# Patient Record
Sex: Male | Born: 1980
Health system: Southern US, Community
[De-identification: ages and names within clinical notes are randomized; demographics above are authoritative.]

## PROBLEM LIST (undated history)

## (undated) DIAGNOSIS — E559 Vitamin D deficiency, unspecified: Secondary | ICD-10-CM

## (undated) DIAGNOSIS — E669 Obesity, unspecified: Secondary | ICD-10-CM

## (undated) DIAGNOSIS — D72819 Decreased white blood cell count, unspecified: Secondary | ICD-10-CM

## (undated) DIAGNOSIS — L309 Dermatitis, unspecified: Secondary | ICD-10-CM

## (undated) DIAGNOSIS — J309 Allergic rhinitis, unspecified: Secondary | ICD-10-CM

## (undated) DIAGNOSIS — E66811 Obesity, class 1: Secondary | ICD-10-CM

## (undated) DIAGNOSIS — M5412 Radiculopathy, cervical region: Secondary | ICD-10-CM

## (undated) DIAGNOSIS — E781 Pure hyperglyceridemia: Secondary | ICD-10-CM

## (undated) HISTORY — PX: NO PAST SURGERIES: SHX2092

## (undated) HISTORY — DX: Vitamin D deficiency, unspecified: E55.9

## (undated) HISTORY — DX: Allergic rhinitis, unspecified: J30.9

## (undated) HISTORY — DX: Obesity, unspecified: E66.9

## (undated) HISTORY — DX: Pure hyperglyceridemia: E78.1

## (undated) HISTORY — DX: Obesity, class 1: E66.811

## (undated) HISTORY — DX: Decreased white blood cell count, unspecified: D72.819

## (undated) HISTORY — DX: Dermatitis, unspecified: L30.9

## (undated) HISTORY — DX: Radiculopathy, cervical region: M54.12

---

## 2002-04-01 ENCOUNTER — Encounter: Payer: Self-pay | Admitting: Emergency Medicine

## 2002-04-01 ENCOUNTER — Emergency Department (HOSPITAL_COMMUNITY): Admission: AC | Admit: 2002-04-01 | Discharge: 2002-04-02 | Payer: Self-pay

## 2002-04-02 ENCOUNTER — Encounter: Payer: Self-pay | Admitting: Emergency Medicine

## 2002-04-04 ENCOUNTER — Emergency Department (HOSPITAL_COMMUNITY): Admission: EM | Admit: 2002-04-04 | Discharge: 2002-04-04 | Payer: Self-pay | Admitting: Emergency Medicine

## 2002-04-08 ENCOUNTER — Emergency Department (HOSPITAL_COMMUNITY): Admission: EM | Admit: 2002-04-08 | Discharge: 2002-04-08 | Payer: Self-pay

## 2012-09-08 DIAGNOSIS — M5412 Radiculopathy, cervical region: Secondary | ICD-10-CM

## 2012-09-08 HISTORY — DX: Radiculopathy, cervical region: M54.12

## 2013-09-05 ENCOUNTER — Ambulatory Visit (INDEPENDENT_AMBULATORY_CARE_PROVIDER_SITE_OTHER): Payer: BC Managed Care – PPO | Admitting: Physician Assistant

## 2013-09-05 ENCOUNTER — Ambulatory Visit: Payer: BC Managed Care – PPO

## 2013-09-05 VITALS — BP 120/72 | HR 56 | Temp 97.6°F | Resp 18 | Ht 67.0 in | Wt 212.0 lb

## 2013-09-05 DIAGNOSIS — M545 Low back pain, unspecified: Secondary | ICD-10-CM

## 2013-09-05 DIAGNOSIS — M25562 Pain in left knee: Secondary | ICD-10-CM

## 2013-09-05 DIAGNOSIS — M542 Cervicalgia: Secondary | ICD-10-CM

## 2013-09-05 MED ORDER — MELOXICAM 15 MG PO TABS: 15.0000 mg | ORAL_TABLET | Freq: Every day | ORAL | Status: DC

## 2013-09-05 MED ORDER — CYCLOBENZAPRINE HCL 10 MG PO TABS: 10.0000 mg | ORAL_TABLET | Freq: Three times a day (TID) | ORAL | Status: DC | PRN

## 2013-09-05 NOTE — Patient Instructions (Signed)
The cyclobenzaprine can cause drowsiness.  Do not take it and plan to drive, or do any other activity where you need to be alert. You need to use good body mechanics and extra padding for the LEFT knee while working.
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
## 2013-09-27 ENCOUNTER — Ambulatory Visit (INDEPENDENT_AMBULATORY_CARE_PROVIDER_SITE_OTHER): Payer: BC Managed Care – PPO | Admitting: Family Medicine

## 2013-09-27 VITALS — BP 122/82 | HR 60 | Temp 98.1°F | Resp 16 | Ht 68.0 in | Wt 209.0 lb

## 2013-09-27 DIAGNOSIS — R202 Paresthesia of skin: Principal | ICD-10-CM

## 2013-09-27 DIAGNOSIS — R209 Unspecified disturbances of skin sensation: Secondary | ICD-10-CM

## 2013-09-27 DIAGNOSIS — R200 Anesthesia of skin: Secondary | ICD-10-CM

## 2013-09-27 MED ORDER — TIZANIDINE HCL 4 MG PO TABS: 4.0000 mg | ORAL_TABLET | Freq: Every day | ORAL | Status: DC

## 2013-09-27 MED ORDER — PREDNISONE 20 MG PO TABS: 40.0000 mg | ORAL_TABLET | Freq: Every day | ORAL | Status: DC
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
## 2013-09-27 NOTE — Patient Instructions (Signed)
Paresthesia °Paresthesia is an abnormal burning or prickling sensation. This sensation is generally felt in the hands, arms, legs, or feet. However, it may occur in any part of the body. It is usually not painful. The feeling may be described as: °· Tingling or numbness. °· "Pins and needles." °· Skin crawling. °· Buzzing. °· Limbs "falling asleep." °· Itching. °Most people experience temporary (transient) paresthesia at some time in their lives. °CAUSES  °Paresthesia may occur when you breathe too quickly (hyperventilation). It can also occur without any apparent cause. Commonly, paresthesia occurs when pressure is placed on a nerve. The feeling quickly goes away once the pressure is removed. For some people, however, paresthesia is a long-lasting (chronic) condition caused by an underlying disorder. The underlying disorder may be: °· A traumatic, direct injury to nerves. Examples include a: °· Broken (fractured) neck. °· Fractured skull. °· A disorder affecting the brain and spinal cord (central nervous system). Examples include: °· Transverse myelitis. °· Encephalitis. °· Transient ischemic attack. °· Multiple sclerosis. °· Stroke. °· Tumor or blood vessel problems, such as an arteriovenous malformation pressing against the brain or spinal cord. °· A condition that damages the peripheral nerves (peripheral neuropathy). Peripheral nerves are not part of the brain and spinal cord. These conditions include: °· Diabetes. °· Peripheral vascular disease. °· Nerve entrapment syndromes, such as carpal tunnel syndrome. °· Shingles. °· Hypothyroidism. °· Vitamin B12 deficiencies. °· Alcoholism. °· Heavy metal poisoning (lead, arsenic). °· Rheumatoid arthritis. °· Systemic lupus erythematosus. °DIAGNOSIS  °Your caregiver will attempt to find the underlying cause of your paresthesia. Your caregiver may: °· Take your medical history. °· Perform a physical exam. °· Order various lab tests. °· Order imaging tests. °TREATMENT    °Treatment for paresthesia depends on the underlying cause. °HOME CARE INSTRUCTIONS °· Avoid drinking alcohol. °· You may consider massage or acupuncture to help relieve your symptoms. °· Keep all follow-up appointments as directed by your caregiver. °SEEK IMMEDIATE MEDICAL CARE IF:  °· You feel weak. °· You have trouble walking or moving. °· You have problems with speech or vision. °· You feel confused. °· You cannot control your bladder or bowel movements. °· You feel numbness after an injury. °· You faint. °· Your burning or prickling feeling gets worse when walking. °· You have pain, cramps, or dizziness. °· You develop a rash. °MAKE SURE YOU: °· Understand these instructions. °· Will watch your condition. °· Will get help right away if you are not doing well or get worse. °Document Released: 08/15/2002 Document Revised: 11/17/2011 Document Reviewed: 05/16/2011 °ExitCare® Patient Information ©2014 [employer]. ° °

## 2013-11-07 ENCOUNTER — Telehealth: Payer: Self-pay

## 2013-11-07 DIAGNOSIS — M79609 Pain in unspecified limb: Secondary | ICD-10-CM

## 2013-11-07 DIAGNOSIS — R202 Paresthesia of skin: Secondary | ICD-10-CM

## 2013-11-07 NOTE — Telephone Encounter (Signed)
Pt would like to talk with someone about a referral to a specialist -believe it will be orthopedic  Best number [phone]

## 2013-11-07 NOTE — Telephone Encounter (Signed)
Left message to return call 

## 2013-11-08 NOTE — Telephone Encounter (Signed)
Dr. Elbert EwingsL, pt is still having right arm paresthesia and wants to know if we can refer him somewhere. Says he's weary about wanting to come back in here because last time he came in, he sat next to someone coughing for over an hour and was sick a few days later himself. Please advise. Thanks

## 2013-11-08 NOTE — Telephone Encounter (Signed)
LMOM to return call.

## 2013-11-09 NOTE — Telephone Encounter (Signed)
Referral placed to neuro. Pt notified.

## 2013-11-10 ENCOUNTER — Telehealth: Payer: Self-pay

## 2013-11-10 NOTE — Telephone Encounter (Signed)
Looks like we referred pt to neurology, which was ok with the pt, except they ae requiring pt to pay $200 up front and he can't afford that. Wants to know what he should do

## 2013-11-14 ENCOUNTER — Ambulatory Visit: Payer: BC Managed Care – PPO | Admitting: Diagnostic Neuroimaging

## 2014-02-27 ENCOUNTER — Encounter: Payer: Self-pay | Admitting: *Deleted

## 2014-02-27 ENCOUNTER — Telehealth: Payer: Self-pay | Admitting: *Deleted

## 2014-02-27 NOTE — Telephone Encounter (Signed)
Medication List and allergies:  Reviewed and updated   Local prescriptions:  [facility]  Immunizations Due: Pna, Tdap  A/P FH, PSH, or Personal Hx: Reviewed and updated Flu vaccine: Never Tdap: None on record PNA: Never    To Discuss with Provider: Not at this time

## 2014-02-27 NOTE — Telephone Encounter (Signed)
Left message on "home number with vm that states [employer]" for the patient to return my call.

## 2014-02-28 ENCOUNTER — Encounter: Payer: Self-pay | Admitting: Internal Medicine

## 2014-02-28 ENCOUNTER — Ambulatory Visit (INDEPENDENT_AMBULATORY_CARE_PROVIDER_SITE_OTHER): Payer: BC Managed Care – PPO | Admitting: Internal Medicine

## 2014-02-28 VITALS — BP 123/79 | HR 73 | Temp 98.2°F | Ht 68.7 in | Wt 201.0 lb

## 2014-02-28 DIAGNOSIS — J309 Allergic rhinitis, unspecified: Secondary | ICD-10-CM | POA: Insufficient documentation

## 2014-02-28 DIAGNOSIS — J3089 Other allergic rhinitis: Secondary | ICD-10-CM | POA: Insufficient documentation

## 2014-02-28 DIAGNOSIS — Z0000 Encounter for general adult medical examination without abnormal findings: Secondary | ICD-10-CM | POA: Insufficient documentation

## 2014-02-28 DIAGNOSIS — L989 Disorder of the skin and subcutaneous tissue, unspecified: Secondary | ICD-10-CM | POA: Insufficient documentation

## 2014-02-28 NOTE — Patient Instructions (Signed)
Please come back fasting for labs at your earliest convenience: FLP, CMP, CBC, TSH, HIV--- dx V70  For allergies use OTC Flonase nasal spray, 2 sprays on each side of the nose daily  Next visit : one year, fasting for another physical     Testicular Self-Exam A self-examination of your testicles involves looking at and feeling your testicles for abnormal lumps or swelling. Several things can cause swelling, lumps, or pain in your testicles. Some of these causes are:  Injuries.  Inflammation.  Infection.  Accumulation of fluids around your testicle (hydrocele).  Twisted testicles (testicular torsion).  Testicular cancer. Self-examination of the testicles and groin areas may be advised if you are at risk for testicular cancer. Risks for testicular cancer include:  An undescended testicle (cryptorchidism).  A history of previous testicular cancer.  A family history of testicular cancer. The testicles are easiest to examine after warm baths or showers and are more difficult to examine when you are cold. This is because the muscles attached to the testicles retract and pull them up higher or into the abdomen. Follow these steps while you are standing:  Hold your penis away from your body.  Roll one testicle between your thumb and forefinger, feeling the entire testicle.  Roll the other testicle between your thumb and forefinger, feeling the entire testicle. Feel for lumps, swelling, or discomfort. A normal testicle is egg shaped and feels firm. It is smooth and not tender. The spermatic cord can be felt as a firm spaghetti-like cord at the back of your testicle. It is also important to examine the crease between the front of your leg and your abdomen. Feel for any bumps that are tender. These could be enlarged lymph nodes.  Document Released: 12/01/2000 Document Revised: 04/27/2013 Document Reviewed: 02/14/2013 [facility] Patient Information 2015 [employer] [place]. This information is  not intended to replace advice given to you by your health care provider. Make sure you discuss any questions you have with your health care provider.

## 2014-02-28 NOTE — Assessment & Plan Note (Signed)
Last Td --? Declined  Discussed diet, exercise, self testicular exam. Will come back fasting for labs, including HIV.

## 2014-02-28 NOTE — Progress Notes (Signed)
Pre visit review using our clinic review tool, if applicable. No additional management support is needed unless otherwise documented below in the visit note. 

## 2014-02-28 NOTE — Assessment & Plan Note (Signed)
Increased size of right suprapubic mole; also has a penile skin lesion possibly a wart. Plan: Refer to dermatology

## 2014-02-28 NOTE — Assessment & Plan Note (Signed)
Reports nasal congestion and occasional headaches when he goes to his basement at home, thinks is an allergy. Plan: Trial with Flonase

## 2014-02-28 NOTE — Progress Notes (Signed)
   Subjective:    Patient ID: Geoffrey Contreras, male    DOB: [birth year], 33 y.o.   MRN: [id number]  DOS:  02/28/2014 Type of  Visit: New patient, request a general checkup History: In general feeling well. Does have a mole on the right suprapubic area, has been growing slowly over the years and looks darker. Has a skin lesion of the penis as well.   ROS Diet--  Ok most of the time  Exercise-- active at work, no exercise  No  CP, SOB. No palpitations  Denies  nausea, vomiting diarrhea, blood in the stools (-) cough, sputum production (-) wheezing, chest congestion No dysuria, gross hematuria, difficulty urinating  No anxiety, depression    Past Medical History  Diagnosis Date  . Allergic rhinitis     Past Surgical History  Procedure Laterality Date  . No past surgeries      History   Social History  . Marital Status: Married    Spouse Name: Geoffrey Contreras    Number of Children: 1  . Years of Education: 12th   Occupational History  . flooring; matress sales    Social History Main Topics  . Smoking status: Former Smoker -- 0.50 packs/day for 5 years    Types: Cigarettes    Quit date: 09/06/2007  . Smokeless tobacco: Never Used  . Alcohol Use: 1.0 - 1.5 oz/week    2-3 drink(s) per week     Comment: only on weeknds  . Drug Use: No  . Sexual Activity: Not on file   Other Topics Concern  . Not on file   Social History Narrative   Lives with his wife and their son.        Medication List    Notice As of 02/28/2014  5:08 PM   You have not been prescribed any medications.     Family History  Problem Relation Age of Onset  . Breast cancer Mother 455  . Cancer Maternal Grandmother     Lung  . Colon cancer Neg Hx   . Prostate cancer Neg Hx   . CAD Neg Hx   . Diabetes Mother     ??       Objective:   Physical Exam BP 123/79  Pulse 73  Temp(Src) 98.2 F (36.8 C)  Ht 5' 8.7" (1.745 m)  Wt 201 lb (91.173 kg)  BMI 29.94 kg/m2  SpO2 98% General -- alert,  well-developed, NAD.  Neck --no thyromegaly  HEENT-- Not pale.  Lungs -- normal respiratory effort, no intercostal retractions, no accessory muscle use, and normal breath sounds.  Heart-- normal rate, regular rhythm, no murmur.  Abdomen-- Not distended, good bowel sounds,soft, non-tender. Skin-- 0.5x0.5 cm bicolor mole at R suprapubic area  Lesion at penile shaft ~ 3 mm  Extremities-- no pretibial edema bilaterally  Neurologic--  alert & oriented X3. Speech normal, gait appropriate for age, strength symmetric and appropriate for age.  Psych-- Cognition and judgment appear intact. Cooperative with normal attention span and concentration. No anxious or depressed appearing.      Assessment & Plan:

## 2014-03-30 ENCOUNTER — Other Ambulatory Visit (INDEPENDENT_AMBULATORY_CARE_PROVIDER_SITE_OTHER): Payer: BC Managed Care – PPO

## 2014-03-30 DIAGNOSIS — Z0000 Encounter for general adult medical examination without abnormal findings: Secondary | ICD-10-CM

## 2014-03-30 DIAGNOSIS — D72820 Lymphocytosis (symptomatic): Secondary | ICD-10-CM

## 2014-03-30 LAB — COMPREHENSIVE METABOLIC PANEL
ALBUMIN: 3.6 g/dL (ref 3.5–5.2)
ALT: 28 U/L (ref 0–53)
AST: 23 U/L (ref 0–37)
Alkaline Phosphatase: 53 U/L (ref 39–117)
BUN: 9 mg/dL (ref 6–23)
CHLORIDE: 106 meq/L (ref 96–112)
CO2: 28 meq/L (ref 19–32)
Calcium: 9 mg/dL (ref 8.4–10.5)
Creatinine, Ser: 0.9 mg/dL (ref 0.4–1.5)
GFR: 110.69 mL/min (ref >60.00)
GLUCOSE: 90 mg/dL (ref 70–99)
POTASSIUM: 4.1 meq/L (ref 3.5–5.1)
Sodium: 139 mEq/L (ref 135–145)
Total Bilirubin: 1 mg/dL (ref 0.2–1.2)
Total Protein: 7 g/dL (ref 6.0–8.3)

## 2014-03-30 LAB — LIPID PANEL
Cholesterol: 149 mg/dL (ref 0–200)
HDL: 35.4 mg/dL — ABNORMAL LOW (ref >39.00)
LDL Cholesterol: 83 mg/dL (ref 0–99)
NonHDL: 113.6
Total CHOL/HDL Ratio: 4
Triglycerides: 153 mg/dL — ABNORMAL HIGH (ref 0.0–149.0)
VLDL: 30.6 mg/dL (ref 0.0–40.0)

## 2014-03-30 LAB — CBC WITH DIFFERENTIAL/PLATELET
BASOS PCT: 0.4 % (ref 0.0–3.0)
Basophils Absolute: 0 10*3/uL (ref 0.0–0.1)
Eosinophils Absolute: 0.1 10*3/uL (ref 0.0–0.7)
Eosinophils Relative: 0.9 % (ref 0.0–5.0)
HCT: 41.7 % (ref 39.0–52.0)
HEMOGLOBIN: 14.1 g/dL (ref 13.0–17.0)
Lymphs Abs: 3.8 10*3/uL (ref 0.7–4.0)
MCHC: 33.8 g/dL (ref 30.0–36.0)
MCV: 85.4 fl (ref 78.0–100.0)
MONOS PCT: 9.3 % (ref 3.0–12.0)
Monocytes Absolute: 0.6 10*3/uL (ref 0.1–1.0)
NEUTROS ABS: 1.8 10*3/uL (ref 1.4–7.7)
Neutrophils Relative %: 29 % — ABNORMAL LOW (ref 43.0–77.0)
Platelets: 164 10*3/uL (ref 150.0–400.0)
RBC: 4.89 Mil/uL (ref 4.22–5.81)
RDW: 13.8 % (ref 11.5–15.5)
WBC: 6.4 10*3/uL (ref 4.0–10.5)

## 2014-03-30 LAB — TSH: TSH: 0.45 u[IU]/mL (ref 0.35–4.50)

## 2014-03-31 LAB — HIV ANTIBODY (ROUTINE TESTING W REFLEX): HIV: NONREACTIVE

## 2014-04-04 NOTE — Addendum Note (Signed)
Addended by: Eustace QuailEABOLD, Faelynn Wynder J on: 04/04/2014 08:57 AM   Modules accepted: Orders

## 2014-04-18 ENCOUNTER — Other Ambulatory Visit: Payer: BC Managed Care – PPO

## 2014-04-19 ENCOUNTER — Other Ambulatory Visit (INDEPENDENT_AMBULATORY_CARE_PROVIDER_SITE_OTHER): Payer: BC Managed Care – PPO

## 2014-04-19 ENCOUNTER — Ambulatory Visit: Payer: BC Managed Care – PPO

## 2014-04-19 DIAGNOSIS — D72820 Lymphocytosis (symptomatic): Secondary | ICD-10-CM

## 2014-04-19 DIAGNOSIS — D729 Disorder of white blood cells, unspecified: Secondary | ICD-10-CM

## 2014-04-19 LAB — CBC WITH DIFFERENTIAL/PLATELET
BASOS PCT: 0.6 % (ref 0.0–3.0)
Basophils Absolute: 0 10*3/uL (ref 0.0–0.1)
EOS PCT: 0.9 % (ref 0.0–5.0)
Eosinophils Absolute: 0.1 10*3/uL (ref 0.0–0.7)
HEMATOCRIT: 41.6 % (ref 39.0–52.0)
Hemoglobin: 14 g/dL (ref 13.0–17.0)
LYMPHS ABS: 3.1 10*3/uL (ref 0.7–4.0)
Lymphocytes Relative: 56.5 % — ABNORMAL HIGH (ref 12.0–46.0)
MCHC: 33.7 g/dL (ref 30.0–36.0)
MCV: 85.9 fl (ref 78.0–100.0)
Monocytes Absolute: 0.4 10*3/uL (ref 0.1–1.0)
Monocytes Relative: 8.1 % (ref 3.0–12.0)
NEUTROS PCT: 33.9 % — AB (ref 43.0–77.0)
Neutro Abs: 1.9 10*3/uL (ref 1.4–7.7)
Platelets: 155 10*3/uL (ref 150.0–400.0)
RBC: 4.84 Mil/uL (ref 4.22–5.81)
RDW: 14.2 % (ref 11.5–15.5)
WBC: 5.6 10*3/uL (ref 4.0–10.5)

## 2014-04-20 LAB — PATHOLOGIST SMEAR REVIEW

## 2014-04-21 ENCOUNTER — Other Ambulatory Visit: Payer: Self-pay | Admitting: Internal Medicine

## 2014-04-21 ENCOUNTER — Telehealth: Payer: Self-pay

## 2014-04-21 DIAGNOSIS — R7989 Other specified abnormal findings of blood chemistry: Secondary | ICD-10-CM

## 2014-04-21 NOTE — Telephone Encounter (Signed)
Caller name:Ronelle Relation to pt: Call back number:[phone] Pharmacy:  Reason for call: Denyse AmassCorey called to see if is lab results were in

## 2014-04-21 NOTE — Telephone Encounter (Signed)
LM for return call, see lab results.

## 2014-04-24 NOTE — Telephone Encounter (Signed)
Advised patient per results.  He would like to talk with you about them. Please call if available

## 2014-04-24 NOTE — Telephone Encounter (Signed)
Advise patient, his lymphocytes has been  elevated twice , i don't have a good explanation, will request a hematologist opinion and see if something needs to be done. Patient in agreement.

## 2014-05-01 ENCOUNTER — Encounter: Payer: Self-pay | Admitting: Internal Medicine

## 2014-05-01 ENCOUNTER — Telehealth: Payer: Self-pay

## 2014-05-01 ENCOUNTER — Ambulatory Visit (INDEPENDENT_AMBULATORY_CARE_PROVIDER_SITE_OTHER): Payer: BC Managed Care – PPO | Admitting: Internal Medicine

## 2014-05-01 VITALS — BP 112/84 | HR 60 | Temp 97.8°F | Wt 203.2 lb

## 2014-05-01 DIAGNOSIS — M7521 Bicipital tendinitis, right shoulder: Secondary | ICD-10-CM

## 2014-05-01 DIAGNOSIS — M7520 Bicipital tendinitis, unspecified shoulder: Secondary | ICD-10-CM
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
## 2014-05-01 NOTE — Telephone Encounter (Signed)
Patient left voicemail today at 11:37am wanting his records sent to his new doctor at [facility]. If they are only wanting his last visit, then it's already in Epic. The paper chart is from 2011 and is now in storage. Not sure if he wants records that old to be sent as well. Left voicemail for patient to call back at CB# [phone].

## 2014-05-01 NOTE — Patient Instructions (Addendum)
meloxicam once a day  as needed for pain. Always take it with food because may cause gastritis and ulcers. If you notice nausea, stomach pain, change in the color of stools --->  Stop the medicine and let us know  Call if no better in 2 weeks  ICE the area twice a day

## 2014-05-01 NOTE — Progress Notes (Signed)
Pre-visit discussion using our clinic review tool. No additional management support is needed unless otherwise documented below in the visit note.  

## 2014-05-08 ENCOUNTER — Telehealth: Payer: Self-pay

## 2014-05-08 NOTE — Telephone Encounter (Signed)
Don't think is related with the elevated white count. Okay to take meloxicam as recommended.

## 2014-05-08 NOTE — Telephone Encounter (Signed)
Geoffrey Contreras Spouse [phone] or [phone]  Florentina Addison called to tell us that [name] is still having pain but it is now in his elbow and wrist area, and it feels like when you hit your funny bone. He is not taking any pain medicines or muscle relaxer's at this time. She is also wondering if it could have anything to do with the elevated white blood count.

## 2014-05-08 NOTE — Telephone Encounter (Signed)
Please advise 

## 2014-05-09 NOTE — Telephone Encounter (Signed)
Spoke with Pts wife and gave her instructions.

## 2014-05-17 ENCOUNTER — Telehealth: Payer: Self-pay | Admitting: Internal Medicine

## 2014-05-17 NOTE — Telephone Encounter (Signed)
Spoke with Pt, Pt has scheduled an appt for tomorrow at 11 am to speak to Dr. Drue Novel about Lyme Disease and his arm pain. Pt believes he has a nerve problem, he has numbness and tingling in his arm. Pt doesn't believe Ortho can help if it is a nerve problem.

## 2014-05-17 NOTE — Telephone Encounter (Signed)
Caller name: Anddy  Relation to pt: self  Call back number: [phone]  Reason for call:   Pt  wanted to inform you of pain in right arm persisting from last visit 05/01/14. Pt stated he feels like he has lyme disease pt dog had it.   pt would like to come in for blood work. Please advise

## 2014-05-17 NOTE — Telephone Encounter (Signed)
1. As far as the arm pain, arrange a sports medicine referral  (dx bicipital tendinitis).  2. If he thinks he has Lyme disease, schedule visit here to see if he has any symptoms consistent with that condition (Can't do blood work without checking  him first)

## 2014-05-17 NOTE — Telephone Encounter (Signed)
LMOM for Pt to return call.  

## 2014-05-17 NOTE — Telephone Encounter (Signed)
Please advise 

## 2014-05-18 ENCOUNTER — Encounter: Payer: Self-pay | Admitting: Internal Medicine

## 2014-05-18 ENCOUNTER — Ambulatory Visit (INDEPENDENT_AMBULATORY_CARE_PROVIDER_SITE_OTHER): Payer: BC Managed Care – PPO | Admitting: Internal Medicine

## 2014-05-18 VITALS — BP 118/82 | HR 61 | Temp 98.2°F

## 2014-05-18 DIAGNOSIS — M79609 Pain in unspecified limb: Secondary | ICD-10-CM

## 2014-05-18 DIAGNOSIS — W57XXXA Bitten or stung by nonvenomous insect and other nonvenomous arthropods, initial encounter: Secondary | ICD-10-CM

## 2014-05-18 DIAGNOSIS — M79601 Pain in right arm: Secondary | ICD-10-CM

## 2014-05-18 DIAGNOSIS — T148 Other injury of unspecified body region: Secondary | ICD-10-CM

## 2014-05-18 NOTE — Progress Notes (Signed)
Pre visit review using our clinic review tool, if applicable. No additional management support is needed unless otherwise documented below in the visit note. 

## 2014-05-18 NOTE — Patient Instructions (Addendum)
  Call if you have any symptoms of Lyme or a rash    Lyme Disease You may have been bitten by a tick and are to watch for the development of Lyme Disease. Lyme Disease is an infection that is caused by a bacteria The bacteria causing this disease is named Borreilia burgdorferi. If a tick is infected with this bacteria and then bites you, then Lyme Disease may occur. These ticks are carried by deer and rodents such as rabbits and mice and infest grassy as well as forested areas. Fortunately most tick bites do not cause Lyme Disease.  Lyme Disease is easier to prevent than to treat. First, covering your legs with clothing when walking in areas where ticks are possibly abundant will prevent their attachment because ticks tend to stay within inches of the ground. Second, using insecticides containing DEET can be applied on skin or clothing. Last, because it takes about 12 to 24 hours for the tick to transmit the disease after attachment to the human host, you should inspect your body for ticks twice a day when you are in areas where Lyme Disease is common. You must look thoroughly when searching for ticks. The Ixodes tick that carries Lyme Disease is very small. It is around the size of a sesame seed (picture of tick is not actual size). Removal is best done by grasping the tick by the head and pulling it out. Do not to squeeze the body of the tick. This could inject the infecting bacteria into the bite site. Wash the area of the bite with an antiseptic solution after removal.  Lyme Disease is a disease that may affect many body systems. Because of the small size of the biting tick, most people do not notice being bitten. The first sign of an infection is usually a round red rash that extends out from the center of the tick bite. The center of the lesion may be blood colored (hemorrhagic) or have tiny blisters (vesicular). Most lesions have bright red outer borders and partial central clearing. This rash may  extend out many inches in diameter, and multiple lesions may be present. Other symptoms such as fatigue, headaches, chills and fever, general achiness and swelling of lymph glands may also occur. If this first stage of the disease is left untreated, these symptoms may gradually resolve by themselves, or progressive symptoms may occur because of spread of infection to other areas of the body.  Follow up with your caregiver to have testing and treatment if you have a tick bite and you develop any of the above complaints. Your caregiver may recommend preventative (prophylactic) medications which kill bacteria (antibiotics). Once a diagnosis of Lyme Disease is made, antibiotic treatment is highly likely to cure the disease. Effective treatment of late stage Lyme Disease may require longer courses of antibiotic therapy.  MAKE SURE YOU:   Understand these instructions.  Will watch your condition.  Will get help right away if you are not doing well or get worse. Document Released: 12/01/2000 Document Revised: 11/17/2011 Document Reviewed: 02/02/2009 [facility] Patient Information 2015 [place], [place]. This information is not intended to replace advice given to you by your health care provider. Make sure you discuss any questions you have with your health care provider.

## 2014-05-18 NOTE — Progress Notes (Signed)
   Subjective:    Patient ID: Geoffrey Contreras, male    DOB: 24-Jun-1981, 33 y.o.   MRN: [id number]  DOS:  05/18/2014 Type of visit - description : acute Interval history: Patient is a Product manager, has tick bites on and off, the last time was 2 weeks ago. Never had a target type of rash but sometimes has tick and other bugs bites; occasionally area of bites gets slightly raised. Concerned about Lyme disease. Also continue with right arm pain, this time states that the pain is shooting, on-off  depending on shoulder motion    ROS Denies fever or chills No actual rash. No generalized aches, fatigue. No headaches.   Past Medical History  Diagnosis Date  . Allergic rhinitis     Past Surgical History  Procedure Laterality Date  . No past surgeries      History   Social History  . Marital Status: Married    Spouse Name: Florentina Addison    Number of Children: 1  . Years of Education: 12th   Occupational History  . flooring; matress sales    Social History Main Topics  . Smoking status: Former Smoker -- 0.50 packs/day for 5 years    Types: Cigarettes    Quit date: 09/06/2007  . Smokeless tobacco: Never Used  . Alcohol Use: 1.0 - 1.5 oz/week    2-3 drink(s) per week     Comment: only on weeknds  . Drug Use: No  . Sexual Activity: Not on file   Other Topics Concern  . Not on file   Social History Narrative   Lives with his wife and their son.        Medication List    Notice As of 05/18/2014 11:59 PM   You have not been prescribed any medications.         Objective:   Physical Exam BP 118/82  Pulse 61  Temp(Src) 98.2 F (36.8 C) (Oral)  SpO2 97% General -- alert, well-developed, NAD.  Neck --FROM HEENT-- Not pale.  Lungs -- normal respiratory effort, no intercostal retractions, no accessory muscle use, and normal breath sounds.  Heart-- normal rate, regular rhythm, no murmur.  Extremities-- no pretibial edema bilaterally  Neurologic--  alert & oriented X3.  Speech normal, gait appropriate for age, strength symmetric and appropriate for age.  Psych-- Cognition and judgment appear intact. Cooperative with normal attention span and concentration. No anxious or depressed appearing.      Assessment & Plan:    Tick bites, The patient has occasional  Tick bites   without symptoms of Lyme disease. Recommend avoidance of any bites, if he if develops any peculiar rash or any kind of lyme symptoms he is to let me know. Information about Lyme disease provided.  Arm pain, ongoing issue, refer to orthopedic surgery

## 2014-06-12 ENCOUNTER — Telehealth: Payer: Self-pay | Admitting: Hematology & Oncology

## 2014-06-12 ENCOUNTER — Encounter: Payer: Self-pay | Admitting: Family

## 2014-06-12 ENCOUNTER — Ambulatory Visit: Payer: BC Managed Care – PPO

## 2014-06-12 ENCOUNTER — Ambulatory Visit (HOSPITAL_BASED_OUTPATIENT_CLINIC_OR_DEPARTMENT_OTHER): Payer: BC Managed Care – PPO | Admitting: Family

## 2014-06-12 ENCOUNTER — Ambulatory Visit (HOSPITAL_BASED_OUTPATIENT_CLINIC_OR_DEPARTMENT_OTHER): Payer: BC Managed Care – PPO | Admitting: Lab

## 2014-06-12 VITALS — BP 125/88 | HR 54 | Temp 97.2°F | Resp 18 | Ht 68.0 in | Wt 202.0 lb

## 2014-06-12 DIAGNOSIS — R7989 Other specified abnormal findings of blood chemistry: Secondary | ICD-10-CM

## 2014-06-12 DIAGNOSIS — D72820 Lymphocytosis (symptomatic): Secondary | ICD-10-CM

## 2014-06-12 LAB — CBC WITH DIFFERENTIAL (CANCER CENTER ONLY)
BASO#: 0 10*3/uL (ref 0.0–0.2)
BASO%: 0.2 % (ref 0.0–2.0)
EOS ABS: 0.1 10*3/uL (ref 0.0–0.5)
EOS%: 1 % (ref 0.0–7.0)
HCT: 43.5 % (ref 38.7–49.9)
HGB: 15.2 g/dL (ref 13.0–17.1)
LYMPH#: 3.4 10*3/uL — ABNORMAL HIGH (ref 0.9–3.3)
LYMPH%: 55.3 % — ABNORMAL HIGH (ref 14.0–48.0)
MCH: 29.2 pg (ref 28.0–33.4)
MCHC: 34.9 g/dL (ref 32.0–35.9)
MCV: 84 fL (ref 82–98)
MONO#: 0.6 10*3/uL (ref 0.1–0.9)
MONO%: 9 % (ref 0.0–13.0)
NEUT%: 34.5 % — ABNORMAL LOW (ref 40.0–80.0)
NEUTROS ABS: 2.1 10*3/uL (ref 1.5–6.5)
PLATELETS: 172 10*3/uL (ref 145–400)
RBC: 5.21 10*6/uL (ref 4.20–5.70)
RDW: 13.5 % (ref 11.1–15.7)
WBC: 6.1 10*3/uL (ref 4.0–10.0)

## 2014-06-12 LAB — CHCC SATELLITE - SMEAR

## 2014-06-12 NOTE — Telephone Encounter (Signed)
I spoke w NEW PATIENT today to remind them of their appointment with Dr. Ennever. Also, advised them to bring all medication bottles and insurance card information. ° °
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
## 2014-09-08 DIAGNOSIS — D72819 Decreased white blood cell count, unspecified: Secondary | ICD-10-CM

## 2014-09-08 HISTORY — DX: Decreased white blood cell count, unspecified: D72.819

## 2014-10-09 ENCOUNTER — Encounter: Payer: Self-pay | Admitting: Hematology & Oncology

## 2014-10-09 ENCOUNTER — Other Ambulatory Visit (HOSPITAL_BASED_OUTPATIENT_CLINIC_OR_DEPARTMENT_OTHER): Payer: BLUE CROSS/BLUE SHIELD | Admitting: Lab

## 2014-10-09 ENCOUNTER — Ambulatory Visit (HOSPITAL_BASED_OUTPATIENT_CLINIC_OR_DEPARTMENT_OTHER): Payer: BLUE CROSS/BLUE SHIELD | Admitting: Hematology & Oncology

## 2014-10-09 VITALS — BP 129/86 | HR 72 | Temp 97.3°F | Resp 18 | Ht 68.0 in | Wt 210.0 lb

## 2014-10-09 DIAGNOSIS — D72819 Decreased white blood cell count, unspecified: Secondary | ICD-10-CM

## 2014-10-09 DIAGNOSIS — R7989 Other specified abnormal findings of blood chemistry: Secondary | ICD-10-CM

## 2014-10-09 LAB — CBC WITH DIFFERENTIAL (CANCER CENTER ONLY)
BASO#: 0 10*3/uL (ref 0.0–0.2)
BASO%: 0.3 % (ref 0.0–2.0)
EOS%: 4.2 % (ref 0.0–7.0)
Eosinophils Absolute: 0.3 10*3/uL (ref 0.0–0.5)
HCT: 41 % (ref 38.7–49.9)
HGB: 13.8 g/dL (ref 13.0–17.1)
LYMPH#: 3.3 10*3/uL (ref 0.9–3.3)
LYMPH%: 51 % — AB (ref 14.0–48.0)
MCH: 29.7 pg (ref 28.0–33.4)
MCHC: 33.7 g/dL (ref 32.0–35.9)
MCV: 88 fL (ref 82–98)
MONO#: 0.4 10*3/uL (ref 0.1–0.9)
MONO%: 6.1 % (ref 0.0–13.0)
NEUT#: 2.5 10*3/uL (ref 1.5–6.5)
NEUT%: 38.4 % — AB (ref 40.0–80.0)
PLATELETS: 173 10*3/uL (ref 145–400)
RBC: 4.64 10*6/uL (ref 4.20–5.70)
RDW: 12.5 % (ref 11.1–15.7)
WBC: 6.4 10*3/uL (ref 4.0–10.0)

## 2014-10-09 LAB — CHCC SATELLITE - SMEAR

## 2014-10-09 NOTE — Progress Notes (Signed)
  Hematology and Oncology Follow Up Visit  Geoffrey Contreras [id number] [birth year] 34 y.o. 10/09/2014 Geoffrey Contreras  Principle Diagnosis:   Transient leukopenia  Current Therapy:    Observation     Interim History:  Mr.  Geoffrey Contreras is back for follow-up. He was seen back in October. At that point time, his blood counts look pretty good. He did have a slightly lower percentage of neutrophils. However, his absolute number neutrophils were is okay.  Since we saw him in October, he's been doing well. He's had no problems. He's had no fevers. He's had no rashes. He's had no infections. He's had no nausea vomiting. He's had no leg swelling. He's had no change in bowel or bladder habits.  Overall, his performance status is ECOG 0.  Medications: No current outpatient prescriptions on file.  Allergies:  Allergies  Allergen Reactions  . Penicillins Rash    Past Medical History, Surgical history, Social history, and Family History were reviewed and updated.  Review of Systems: As above  Physical Exam:  height is 5\' 8"  (1.727 m) and weight is 210 lb (95.255 kg). His oral temperature is 97.3 F (36.3 C). His blood pressure is 129/86 and his pulse is 72. His respiration is 18.   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 Head and neck exam shows no ocular or oral lesions. There are no palpable cervical or supraclavicular lymph nodes. Lungs are clear. Cardiac exam regular rate and rhythm with no murmurs, rubs or bruits. Abdomen is soft. Has good bowel sounds. There is no fluid wave. There is a palpable liver or spleen tip. Back exam shows a tenderness over the spine, ribs or hips. Extremity shows no clubbing, cyanosis or edema. Neurological exam shows no focal neurological deficits. Skin exam shows no rashes, ecchymoses or petechia.  Lab Results  Component Value Date   WBC 6.4 10/09/2014   HGB 13.8 10/09/2014   HCT 41.0 10/09/2014   MCV 88 10/09/2014   PLT 173 10/09/2014    Chemistry      Component Value Date/Time   NA 139 03/30/2014 0843   K 4.1 03/30/2014 0843   CL 106 03/30/2014 0843   CO2 28 03/30/2014 0843   BUN 9 03/30/2014 0843   CREATININE 0.9 03/30/2014 0843      Component Value Date/Time   CALCIUM 9.0 03/30/2014 0843   ALKPHOS 53 03/30/2014 0843   AST 23 03/30/2014 0843   ALT 28 03/30/2014 0843   BILITOT 1.0 03/30/2014 0843         Impression and Plan: Geoffrey Contreras is 34 year old gentleman. He basally has normal blood counts. Again, on his differential, he does have a slightly depressed neutrophil number. However, this is asymptomatic.  I did look at his blood under the microscope. I do not see anything that looked suspicious. A good maturation of his white blood cells. He had no immature myeloid cells. He had no hypersegmented polys.  For now, I just don't think that we are adding to his healthcare. I really think that we can just let him know from the clinic. He follow-up with his internal medicine physician routinely.  I reassured him that I just did not think that there is any problems with his blood.   Geoffrey MachoENNEVER,[name], MD 2/1/20165:56 PM

## 2015-02-23 IMAGING — CR DG LUMBAR SPINE COMPLETE 4+V
5 series · 5 of 5 positions shown · non-contrast
Comparison: None.

CLINICAL DATA: Low back pain.

EXAM:
LUMBAR SPINE - COMPLETE 4+ VIEW

[AP]
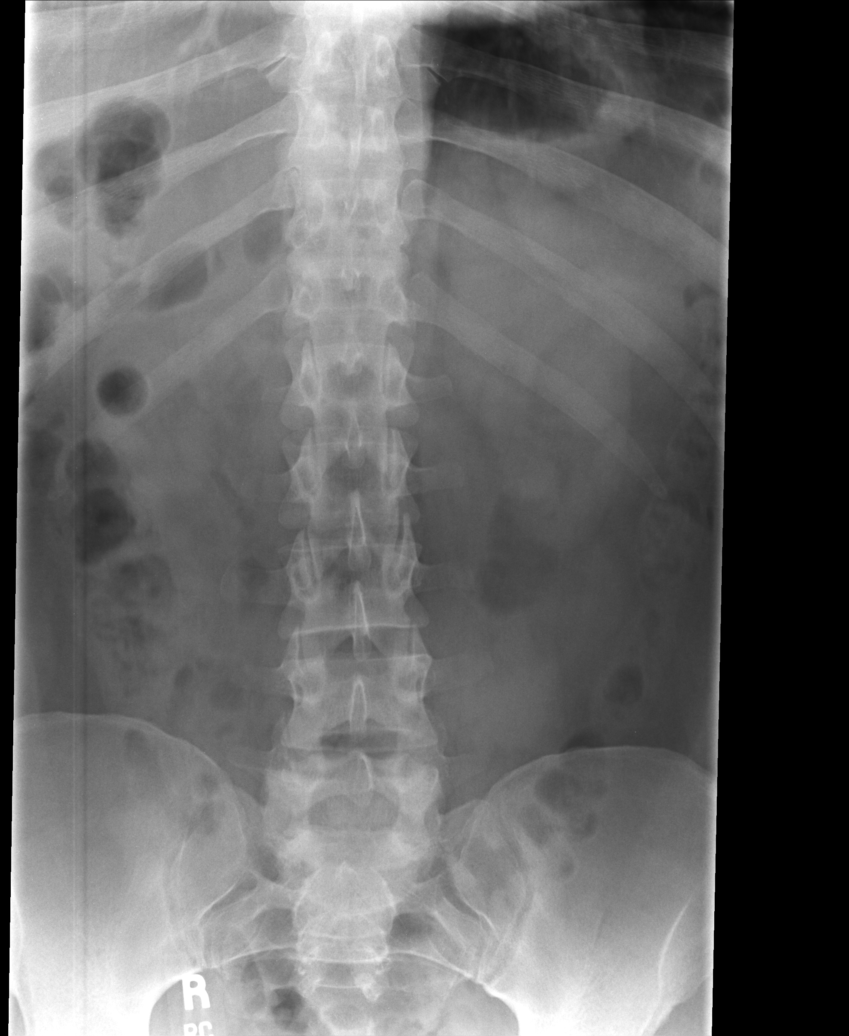

[rpo]
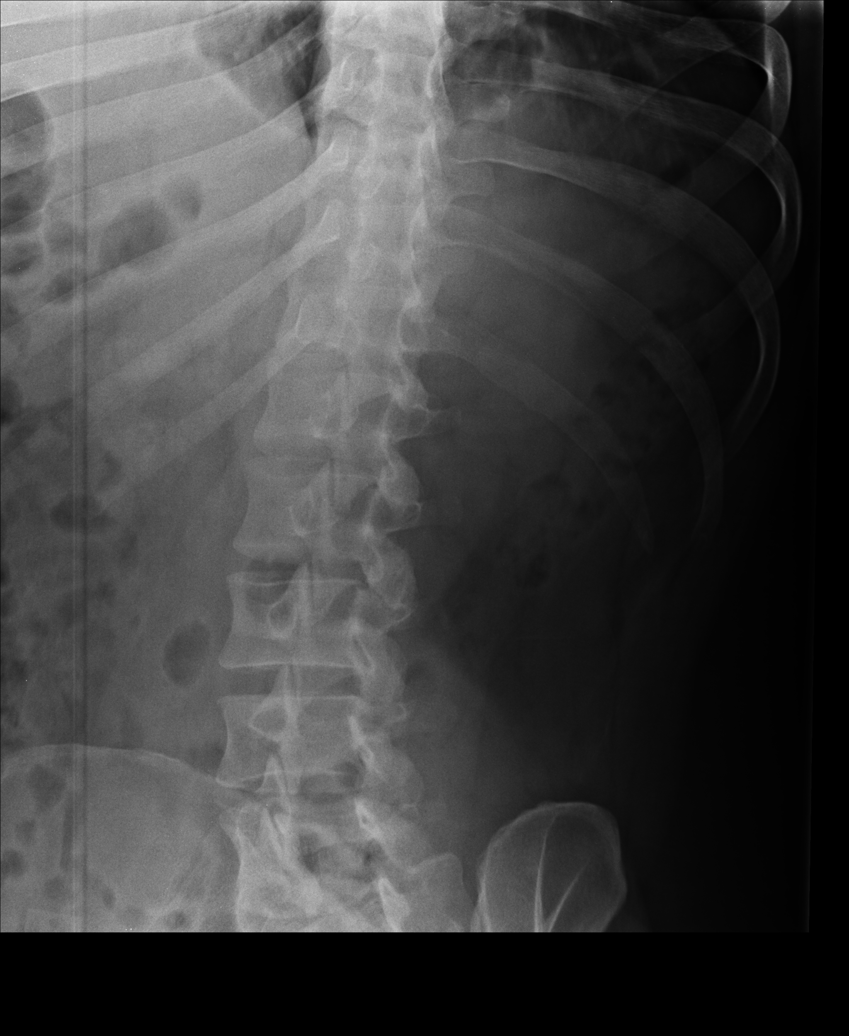

[lpo]
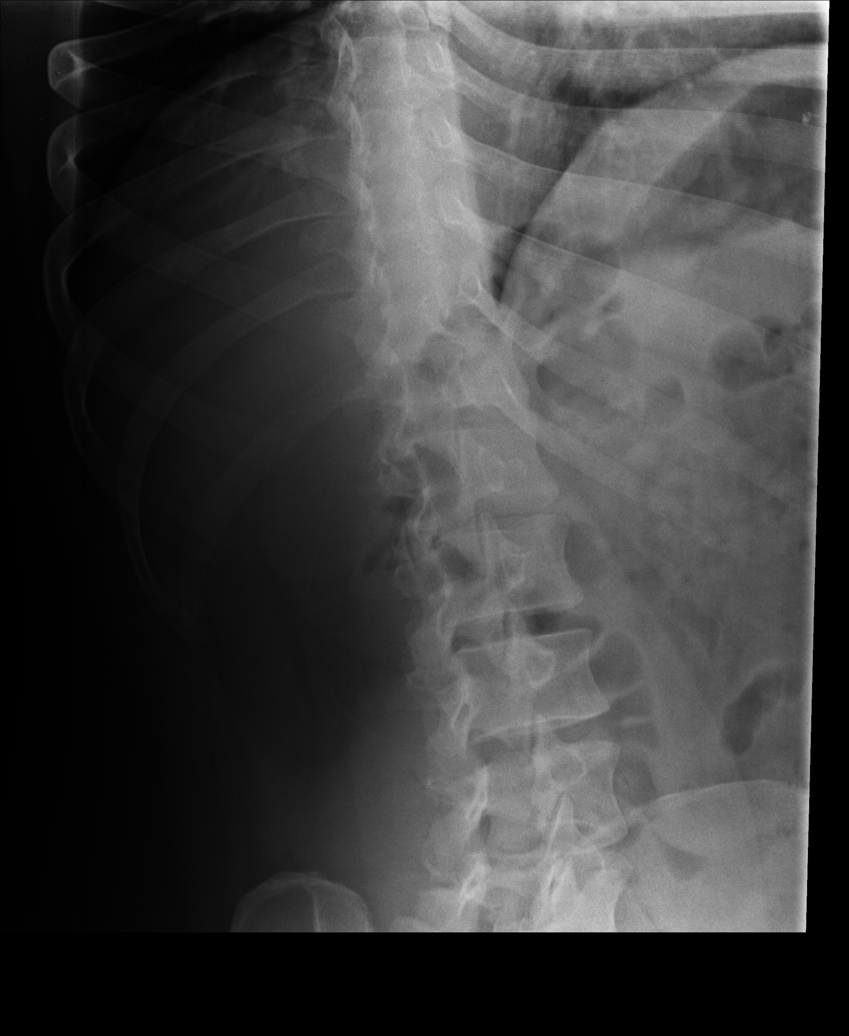

[lateral]
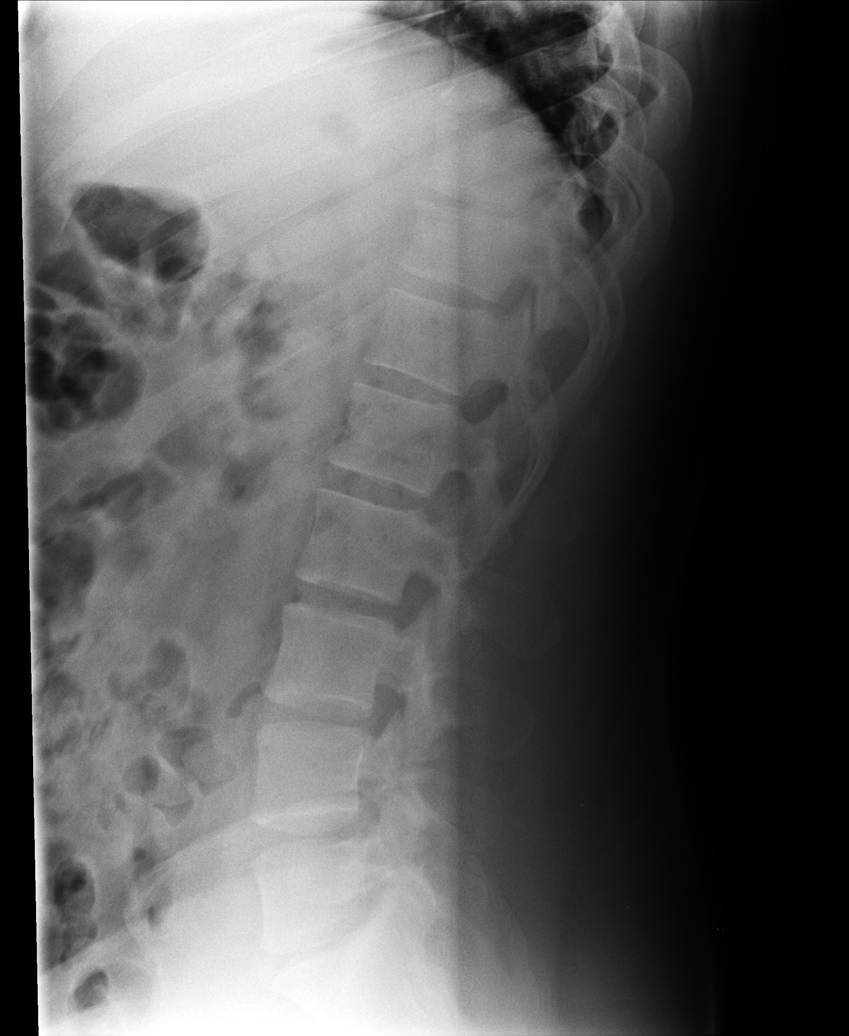

[l5 s1]
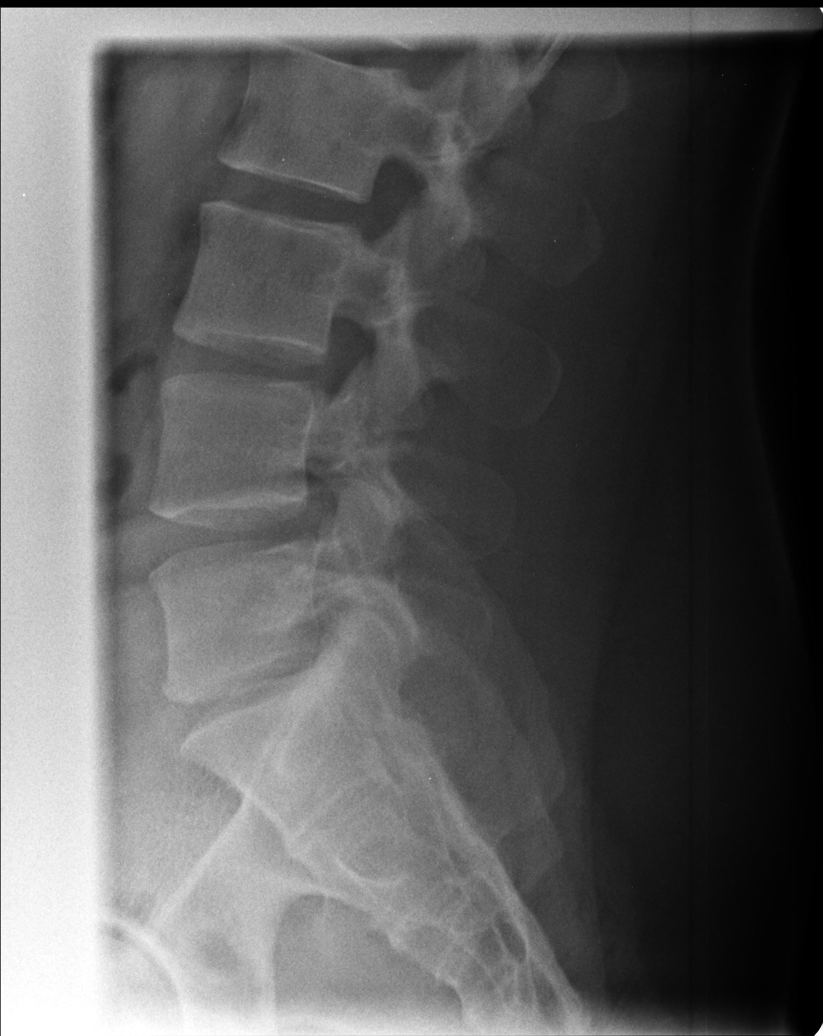

[5 of 5 positions shown; findings below may reference images not displayed]

FINDINGS: There is no evidence of lumbar spine fracture. Alignment is normal.
Intervertebral disc spaces are maintained.
IMPRESSION: Normal lumbar spine.

## 2015-05-10 DIAGNOSIS — E781 Pure hyperglyceridemia: Secondary | ICD-10-CM

## 2015-05-10 HISTORY — DX: Pure hyperglyceridemia: E78.1

## 2015-05-29 ENCOUNTER — Ambulatory Visit (INDEPENDENT_AMBULATORY_CARE_PROVIDER_SITE_OTHER): Payer: BLUE CROSS/BLUE SHIELD | Admitting: Family Medicine

## 2015-05-29 ENCOUNTER — Encounter: Payer: Self-pay | Admitting: Family Medicine

## 2015-05-29 VITALS — BP 137/79 | HR 56 | Temp 97.6°F | Resp 16 | Ht 68.0 in | Wt 215.0 lb

## 2015-05-29 DIAGNOSIS — Z0000 Encounter for general adult medical examination without abnormal findings: Secondary | ICD-10-CM

## 2015-05-29 DIAGNOSIS — R7989 Other specified abnormal findings of blood chemistry: Secondary | ICD-10-CM | POA: Diagnosis not present

## 2015-05-29 LAB — LIPID PANEL
Cholesterol: 162 mg/dL (ref 0–200)
HDL: 37.4 mg/dL — AB (ref >39.00)
NonHDL: 124.13
TRIGLYCERIDES: 215 mg/dL — AB (ref 0.0–149.0)
Total CHOL/HDL Ratio: 4
VLDL: 43 mg/dL — ABNORMAL HIGH (ref 0.0–40.0)

## 2015-05-29 LAB — COMPREHENSIVE METABOLIC PANEL
ALT: 20 U/L (ref 0–53)
AST: 16 U/L (ref 0–37)
Albumin: 4 g/dL (ref 3.5–5.2)
Alkaline Phosphatase: 45 U/L (ref 39–117)
BILIRUBIN TOTAL: 0.4 mg/dL (ref 0.2–1.2)
BUN: 14 mg/dL (ref 6–23)
CO2: 26 meq/L (ref 19–32)
CREATININE: 1.01 mg/dL (ref 0.40–1.50)
Calcium: 8.9 mg/dL (ref 8.4–10.5)
Chloride: 106 mEq/L (ref 96–112)
GFR: 90.06 mL/min (ref >60.00)
GLUCOSE: 94 mg/dL (ref 70–99)
Potassium: 4.2 mEq/L (ref 3.5–5.1)
Sodium: 139 mEq/L (ref 135–145)
Total Protein: 6.8 g/dL (ref 6.0–8.3)

## 2015-05-29 LAB — CBC WITH DIFFERENTIAL/PLATELET
BASOS ABS: 0 10*3/uL (ref 0.0–0.1)
Basophils Relative: 0.6 % (ref 0.0–3.0)
Eosinophils Absolute: 0.1 10*3/uL (ref 0.0–0.7)
Eosinophils Relative: 1.2 % (ref 0.0–5.0)
HCT: 43.8 % (ref 39.0–52.0)
Hemoglobin: 14.5 g/dL (ref 13.0–17.0)
LYMPHS PCT: 45.3 % (ref 12.0–46.0)
Lymphs Abs: 2.8 10*3/uL (ref 0.7–4.0)
MCHC: 33.2 g/dL (ref 30.0–36.0)
MCV: 88.7 fl (ref 78.0–100.0)
MONOS PCT: 10.6 % (ref 3.0–12.0)
Monocytes Absolute: 0.7 10*3/uL (ref 0.1–1.0)
NEUTROS PCT: 42.3 % — AB (ref 43.0–77.0)
Neutro Abs: 2.6 10*3/uL (ref 1.4–7.7)
Platelets: 174 10*3/uL (ref 150.0–400.0)
RBC: 4.94 Mil/uL (ref 4.22–5.81)
RDW: 13.3 % (ref 11.5–15.5)
WBC: 6.2 10*3/uL (ref 4.0–10.5)

## 2015-05-29 LAB — TSH: TSH: 1.5 u[IU]/mL (ref 0.35–4.50)

## 2015-05-29 LAB — LDL CHOLESTEROL, DIRECT: Direct LDL: 110 mg/dL

## 2015-05-29 NOTE — Progress Notes (Signed)
Office Note 05/29/2015  CC:  Chief Complaint  Patient presents with  . Annual Exam    Pt is fasting.    HPI:  Geoffrey Contreras is a 34 y.o. White male who is transferring care to me for convenience to his home [facility]). Patient's most recent primary MD: Dr. Drue Novel. Old records in EPIC/[facility] EMR were reviewed prior to or during today's visit.  He has no acute complaints. Dental visits UTD.  No vision or hearing c/o.   Past Medical History  Diagnosis Date  . Allergic rhinitis   . Leukopenia 2016    Transient (saw Dr. Myna Hidalgo and was released/reassured)  . Cervical radiculopathy at C7 2014    transient numbness R arm when working hard Delbert Harness did w/u and offered spinal steroid injection but pt chose to decline this).    Past Surgical History  Procedure Laterality Date  . No past surgeries      Family History  Problem Relation Age of Onset  . Breast cancer Mother 11  . Cancer Maternal Grandmother     Lung  . Colon cancer Neg Hx   . Prostate cancer Neg Hx   . CAD Neg Hx   . Diabetes Mother     ??    Social History   Social History  . Marital Status: Married    Spouse Name: Florentina Addison  . Number of Children: 1  . Years of Education: 12th   Occupational History  . flooring; matress sales    Social History Main Topics  . Smoking status: Former Smoker -- 1.00 packs/day for 11 years    Types: Cigarettes    Start date: 11/11/1995    Quit date: 09/06/2007  . Smokeless tobacco: Never Used     Comment: QUIT 7 YEARS AGO  . Alcohol Use: 1.2 - 1.8 oz/week    2-3 Standard drinks or equivalent per week     Comment: only on weeknds  . Drug Use: No  . Sexual Activity: Not on file   Other Topics Concern  . Not on file   Social History Narrative   Lives with his wife and their son.   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   Orig from [place], [place].   No tob, occ alcohol.  No hx of alc or drug abuse.    No outpatient encounter prescriptions  on file as of 05/29/2015.   No facility-administered encounter medications on file as of 05/29/2015.    Allergies  Allergen Reactions  . Penicillins Rash    ROS Review of Systems  Constitutional: Negative for fever, chills, appetite change and fatigue.  HENT: Negative for congestion, dental problem, ear pain and sore throat.   Eyes: Negative for discharge, redness and visual disturbance.  Respiratory: Negative for cough, chest tightness, shortness of breath and wheezing.   Cardiovascular: Negative for chest pain, palpitations and leg swelling.  Gastrointestinal: Negative for nausea, vomiting, abdominal pain, diarrhea and blood in stool.  Genitourinary: Negative for dysuria, urgency, frequency, hematuria, flank pain and difficulty urinating.  Musculoskeletal: Negative for myalgias, back pain, joint swelling, arthralgias and neck stiffness.  Skin: Negative for pallor and rash.  Neurological: Positive for numbness (intermittent R arm with lots of work with right arm--see PMH). Negative for dizziness, speech difficulty, weakness and headaches.  Hematological: Negative for adenopathy. Does not bruise/bleed easily.  Psychiatric/Behavioral: Negative for confusion and sleep disturbance. The patient is not nervous/anxious.     PE; Blood pressure 137/79, pulse 56, temperature  97.6 F (36.4 C), temperature source Oral, resp. rate 16, height  (1.727 m), weight 215 lb (97.523 kg), SpO2 95 %. Gen: Alert, well appearing.  Patient is oriented to person, place, time, and situation. AFFECT: pleasant, lucid thought and speech. ENT: Ears: EACs clear, normal epithelium.  TMs with good light reflex and landmarks bilaterally.  Eyes: no injection, icteris, swelling, or exudate.  EOMI, PERRLA. Nose: no drainage or turbinate edema/swelling.  No injection or focal lesion.  Mouth: lips without lesion/swelling.  Oral mucosa pink and moist.  Dentition intact and without obvious caries or gingival swelling.   Oropharynx without erythema, exudate, or swelling.  Neck: supple/nontender.  No LAD, mass, or TM.  Carotid pulses 2+ bilaterally, without bruits. CV: RRR, no m/r/g.   LUNGS: CTA bilat, nonlabored resps, good aeration in all lung fields. ABD: soft, NT, ND, BS normal.  No hepatospenomegaly or mass.  No bruits. EXT: no clubbing, cyanosis, or edema.  Musculoskeletal: no joint swelling, erythema, warmth, or tenderness.  ROM of all joints intact. Skin - no sores or suspicious lesions or rashes or color changes   Pertinent labs:  Lab Results  Component Value Date   TSH 0.45 03/30/2014   Lab Results  Component Value Date   WBC 6.4 10/09/2014   HGB 13.8 10/09/2014   HCT 41.0 10/09/2014   MCV 88 10/09/2014   PLT 173 10/09/2014   Lab Results  Component Value Date   CREATININE 0.9 03/30/2014   BUN 9 03/30/2014   NA 139 03/30/2014   K 4.1 03/30/2014   CL 106 03/30/2014   CO2 28 03/30/2014   Lab Results  Component Value Date   ALT 28 03/30/2014   AST 23 03/30/2014   ALKPHOS 53 03/30/2014   BILITOT 1.0 03/30/2014   Lab Results  Component Value Date   CHOL 149 03/30/2014   Lab Results  Component Value Date   HDL 35.40* 03/30/2014   Lab Results  Component Value Date   LDLCALC 83 03/30/2014   Lab Results  Component Value Date   TRIG 153.0* 03/30/2014   Lab Results  Component Value Date   CHOLHDL 4 03/30/2014     ASSESSMENT AND PLAN:   Transfer pt;  Health maintenance exam: Reviewed age and gender appropriate health maintenance issues (prudent diet, regular exercise, health risks of tobacco and excessive alcohol, use of seatbelts, fire alarms in home, use of sunscreen).  Also reviewed age and gender appropriate health screening as well as vaccine recommendations. He declined flu vaccine and Tdap booster today. Fasting health panel labs drawn today.  An After Visit Summary was printed and given to the patient.  Return in about 1 year (around 05/28/2016) for annual  CPE (fasting).

## 2015-05-29 NOTE — Progress Notes (Signed)
Pre visit review using our clinic review tool, if applicable. No additional management support is needed unless otherwise documented below in the visit note. 

## 2016-12-24 ENCOUNTER — Encounter: Payer: Self-pay | Admitting: Family Medicine

## 2016-12-24 ENCOUNTER — Ambulatory Visit (INDEPENDENT_AMBULATORY_CARE_PROVIDER_SITE_OTHER): Payer: BLUE CROSS/BLUE SHIELD | Admitting: Family Medicine

## 2016-12-24 VITALS — BP 114/75 | HR 57 | Temp 97.3°F | Resp 16 | Ht 68.5 in | Wt 219.8 lb

## 2016-12-24 DIAGNOSIS — Z23 Encounter for immunization: Secondary | ICD-10-CM

## 2016-12-24 DIAGNOSIS — Z0000 Encounter for general adult medical examination without abnormal findings: Secondary | ICD-10-CM

## 2016-12-24 LAB — CBC WITH DIFFERENTIAL/PLATELET
BASOS ABS: 0.1 10*3/uL (ref 0.0–0.1)
Basophils Relative: 0.6 % (ref 0.0–3.0)
EOS ABS: 0.1 10*3/uL (ref 0.0–0.7)
EOS PCT: 1.4 % (ref 0.0–5.0)
HCT: 45.1 % (ref 39.0–52.0)
HEMOGLOBIN: 15.1 g/dL (ref 13.0–17.0)
Lymphocytes Relative: 46.4 % — ABNORMAL HIGH (ref 12.0–46.0)
Lymphs Abs: 3.8 10*3/uL (ref 0.7–4.0)
MCHC: 33.5 g/dL (ref 30.0–36.0)
MCV: 88.7 fl (ref 78.0–100.0)
MONO ABS: 0.5 10*3/uL (ref 0.1–1.0)
Monocytes Relative: 6.2 % (ref 3.0–12.0)
NEUTROS PCT: 45.4 % (ref 43.0–77.0)
Neutro Abs: 3.7 10*3/uL (ref 1.4–7.7)
Platelets: 202 10*3/uL (ref 150.0–400.0)
RBC: 5.08 Mil/uL (ref 4.22–5.81)
RDW: 13.2 % (ref 11.5–15.5)
WBC: 8.1 10*3/uL (ref 4.0–10.5)

## 2016-12-24 LAB — COMPREHENSIVE METABOLIC PANEL
ALT: 19 U/L (ref 0–53)
AST: 17 U/L (ref 0–37)
Albumin: 4.1 g/dL (ref 3.5–5.2)
Alkaline Phosphatase: 47 U/L (ref 39–117)
BUN: 12 mg/dL (ref 6–23)
CO2: 27 mEq/L (ref 19–32)
Calcium: 9.4 mg/dL (ref 8.4–10.5)
Chloride: 104 mEq/L (ref 96–112)
Creatinine, Ser: 1.04 mg/dL (ref 0.40–1.50)
GFR: 86.27 mL/min (ref >60.00)
Glucose, Bld: 95 mg/dL (ref 70–99)
Potassium: 4.5 mEq/L (ref 3.5–5.1)
Sodium: 138 mEq/L (ref 135–145)
Total Bilirubin: 0.6 mg/dL (ref 0.2–1.2)
Total Protein: 7.1 g/dL (ref 6.0–8.3)

## 2016-12-24 LAB — LIPID PANEL
CHOLESTEROL: 192 mg/dL (ref 0–200)
HDL: 44.6 mg/dL (ref >39.00)
NonHDL: 146.99
TRIGLYCERIDES: 264 mg/dL — AB (ref 0.0–149.0)
Total CHOL/HDL Ratio: 4
VLDL: 52.8 mg/dL — AB (ref 0.0–40.0)

## 2016-12-24 LAB — LDL CHOLESTEROL, DIRECT: Direct LDL: 110 mg/dL

## 2016-12-24 LAB — TSH: TSH: 1.96 u[IU]/mL (ref 0.35–4.50)

## 2016-12-24 NOTE — Addendum Note (Signed)
Addended by: Smitty Knudsen on: 12/24/2016 09:38 AM   Modules accepted: Orders

## 2016-12-24 NOTE — Patient Instructions (Signed)
 Health Maintenance, Male A healthy lifestyle and preventive care is important for your health and wellness. Ask your health care provider about what schedule of regular examinations is right for you. What should I know about weight and diet?  Eat a Healthy Diet  Eat plenty of vegetables, fruits, whole grains, low-fat dairy products, and lean protein.  Do not eat a lot of foods high in solid fats, added sugars, or salt. Maintain a Healthy Weight  Regular exercise can help you achieve or maintain a healthy weight. You should:  Do at least 150 minutes of exercise each week. The exercise should increase your heart rate and make you sweat (moderate-intensity exercise).  Do strength-training exercises at least twice a week. Watch Your Levels of Cholesterol and Blood Lipids  Have your blood tested for lipids and cholesterol every 5 years starting at 35 years of age. If you are at high risk for heart disease, you should start having your blood tested when you are 35 years old. You may need to have your cholesterol levels checked more often if:  Your lipid or cholesterol levels are high.  You are older than 35 years of age.  You are at high risk for heart disease. What should I know about cancer screening? Many types of cancers can be detected early and may often be prevented. Lung Cancer  You should be screened every year for lung cancer if:  You are a current smoker who has smoked for at least 30 years.  You are a former smoker who has quit within the past 15 years.  Talk to your health care provider about your screening options, when you should start screening, and how often you should be screened. Colorectal Cancer  Routine colorectal cancer screening usually begins at 35 years of age and should be repeated every 5-10 years until you are 35 years old. You may need to be screened more often if early forms of precancerous polyps or small growths are found. Your health care provider  may recommend screening at an earlier age if you have risk factors for colon cancer.  Your health care provider may recommend using home test kits to check for hidden blood in the stool.  A small camera at the end of a tube can be used to examine your colon (sigmoidoscopy or colonoscopy). This checks for the earliest forms of colorectal cancer. Prostate and Testicular Cancer  Depending on your age and overall health, your health care provider may do certain tests to screen for prostate and testicular cancer.  Talk to your health care provider about any symptoms or concerns you have about testicular or prostate cancer. Skin Cancer  Check your skin from head to toe regularly.  Tell your health care provider about any new moles or changes in moles, especially if:  There is a change in a mole's size, shape, or color.  You have a mole that is larger than a pencil eraser.  Always use sunscreen. Apply sunscreen liberally and repeat throughout the day.  Protect yourself by wearing long sleeves, pants, a wide-brimmed hat, and sunglasses when outside. What should I know about heart disease, diabetes, and high blood pressure?  If you are 18-39 years of age, have your blood pressure checked every 3-5 years. If you are 40 years of age or older, have your blood pressure checked every year. You should have your blood pressure measured twice-once when you are at a hospital or clinic, and once when you are not at   a hospital or clinic. Record the average of the two measurements. To check your blood pressure when you are not at a hospital or clinic, you can use:  An automated blood pressure machine at a pharmacy.  A home blood pressure monitor.  Talk to your health care provider about your target blood pressure.  If you are between 45-79 years old, ask your health care provider if you should take aspirin to prevent heart disease.  Have regular diabetes screenings by checking your fasting blood sugar  level.  If you are at a normal weight and have a low risk for diabetes, have this test once every three years after the age of 45.  If you are overweight and have a high risk for diabetes, consider being tested at a younger age or more often.  A one-time screening for abdominal aortic aneurysm (AAA) by ultrasound is recommended for men aged 65-75 years who are current or former smokers. What should I know about preventing infection? Hepatitis B  If you have a higher risk for hepatitis B, you should be screened for this virus. Talk with your health care provider to find out if you are at risk for hepatitis B infection. Hepatitis C  Blood testing is recommended for:  Everyone born from 1945 through 1965.  Anyone with known risk factors for hepatitis C. Sexually Transmitted Diseases (STDs)  You should be screened each year for STDs including gonorrhea and chlamydia if:  You are sexually active and are younger than 35 years of age.  You are older than 35 years of age and your health care provider tells you that you are at risk for this type of infection.  Your sexual activity has changed since you were last screened and you are at an increased risk for chlamydia or gonorrhea. Ask your health care provider if you are at risk.  Talk with your health care provider about whether you are at high risk of being infected with HIV. Your health care provider may recommend a prescription medicine to help prevent HIV infection. What else can I do?  Schedule regular health, dental, and eye exams.  Stay current with your vaccines (immunizations).  Do not use any tobacco products, such as cigarettes, chewing tobacco, and e-cigarettes. If you need help quitting, ask your health care provider.  Limit alcohol intake to no more than 2 drinks per day. One drink equals 12 ounces of beer, 5 ounces of wine, or 1 ounces of hard liquor.  Do not use street drugs.  Do not share needles.  Ask your health  care provider for help if you need support or information about quitting drugs.  Tell your health care provider if you often feel depressed.  Tell your health care provider if you have ever been abused or do not feel safe at home. This information is not intended to replace advice given to you by your health care provider. Make sure you discuss any questions you have with your health care provider. Document Released: 02/21/2008 Document Revised: 04/23/2016 Document Reviewed: 05/29/2015 Elsevier Interactive Patient Education  2017 [employer] Inc[employer]  

## 2016-12-24 NOTE — Progress Notes (Signed)
Pre visit review using our clinic review tool, if applicable. No additional management support is needed unless otherwise documented below in the visit note. 

## 2016-12-24 NOTE — Progress Notes (Signed)
Office Note 12/24/2016  CC:  Chief Complaint  Patient presents with  . Annual Exam    pt is fasting.     HPI:  Geoffrey Contreras is a 36 y.o. male who is here for annual health maintenance exam.  Eye exam: 2 yrs ago. Dental: preventative visits UTD.  Exercise: nothing outside of work.  Diet: trying to quit eating out as much.  Recent puncture injury to right palm with screwdriver.  It is healed now but he can't really recall when last tetanus booster was.   Past Medical History:  Diagnosis Date  . Allergic rhinitis   . Cervical radiculopathy at C7 2014   transient numbness R arm when working hard Delbert Harness did w/u and offered spinal steroid injection but pt chose to decline this).  . Hypertriglyceridemia 05/2015   with low HDL (no meds; TLC recommended)  . Leukopenia 2016   Transient (saw Dr. Myna Hidalgo and was released/reassured)  . Obesity, Class I, BMI 30-34.9     Past Surgical History:  Procedure Laterality Date  . NO PAST SURGERIES      Family History  Problem Relation Age of Onset  . Breast cancer Mother 19  . Cancer Maternal Grandmother     Lung  . Colon cancer Neg Hx   . Prostate cancer Neg Hx   . CAD Neg Hx   . Diabetes Mother     ??    Social History   Social History  . Marital status: Married    Spouse name: Florentina Addison  . Number of children: 1  . Years of education: 12th   Occupational History  . flooring; matress sales    Social History Main Topics  . Smoking status: Former Smoker    Packs/day: 1.00    Years: 11.00    Types: Cigarettes    Start date: 11/11/1995    Quit date: 09/06/2007  . Smokeless tobacco: Never Used     Comment: QUIT 7 YEARS AGO  . Alcohol use 1.2 - 1.8 oz/week    2 - 3 Standard drinks or equivalent per week     Comment: only on weeknds  . Drug use: No  . Sexual activity: Not on file   Other Topics Concern  . Not on file   Social History Narrative   Lives with his wife and their son.   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   Orig from [place], [place].   No tob, occ alcohol.  No hx of alc or drug abuse.   MEDS: none  Allergies  Allergen Reactions  . Penicillins Rash    ROS Review of Systems  Constitutional: Negative for appetite change, chills, fatigue and fever.  HENT: Negative for congestion, dental problem, ear pain and sore throat.   Eyes: Negative for discharge, redness and visual disturbance.  Respiratory: Negative for cough, chest tightness, shortness of breath and wheezing.   Cardiovascular: Negative for chest pain, palpitations and leg swelling.  Gastrointestinal: Negative for abdominal pain, blood in stool, diarrhea, nausea and vomiting.  Genitourinary: Negative for difficulty urinating, dysuria, flank pain, frequency, hematuria and urgency.  Musculoskeletal: Negative for arthralgias, back pain, joint swelling, myalgias and neck stiffness.  Skin: Negative for pallor and rash.  Neurological: Negative for dizziness, speech difficulty, weakness and headaches.  Hematological: Negative for adenopathy. Does not bruise/bleed easily.  Psychiatric/Behavioral: Negative for confusion and sleep disturbance. The patient is not nervous/anxious.     PE; Blood pressure 114/75, pulse (!) 57,  temperature 97.3 F (36.3 C), temperature source Oral, resp. rate 16, height 5' 8.5" (1.74 m), weight 219 lb 12 oz (99.7 kg), SpO2 96 %. Body mass index is 32.93 kg/m.  Gen: Alert, well appearing.  Patient is oriented to person, place, time, and situation. AFFECT: pleasant, lucid thought and speech. ENT: Ears: EACs clear, normal epithelium.  TMs with good light reflex and landmarks bilaterally.  Eyes: no injection, icteris, swelling, or exudate.  EOMI, PERRLA. Nose: no drainage or turbinate edema/swelling.  No injection or focal lesion.  Mouth: lips without lesion/swelling.  Oral mucosa pink and moist.  Dentition intact and without obvious caries or gingival swelling.  Oropharynx  without erythema, exudate, or swelling.  Neck: supple/nontender.  No LAD, mass, or TM.  Carotid pulses 2+ bilaterally, without bruits. CV: RRR, no m/r/g.   LUNGS: CTA bilat, nonlabored resps, good aeration in all lung fields. ABD: soft, NT, ND, BS normal.  No hepatospenomegaly or mass.  No bruits. EXT: no clubbing, cyanosis, or edema.  Musculoskeletal: no joint swelling, erythema, warmth, or tenderness.  ROM of all joints intact. Skin - no sores or suspicious lesions or rashes or color changes   Pertinent labs:  Lab Results  Component Value Date   TSH 1.50 05/29/2015   Lab Results  Component Value Date   WBC 6.2 05/29/2015   HGB 14.5 05/29/2015   HCT 43.8 05/29/2015   MCV 88.7 05/29/2015   PLT 174.0 05/29/2015   Lab Results  Component Value Date   CREATININE 1.01 05/29/2015   BUN 14 05/29/2015   NA 139 05/29/2015   K 4.2 05/29/2015   CL 106 05/29/2015   CO2 26 05/29/2015   Lab Results  Component Value Date   ALT 20 05/29/2015   AST 16 05/29/2015   ALKPHOS 45 05/29/2015   BILITOT 0.4 05/29/2015   Lab Results  Component Value Date   CHOL 162 05/29/2015   Lab Results  Component Value Date   HDL 37.40 (L) 05/29/2015   Lab Results  Component Value Date   LDLCALC 83 03/30/2014   Lab Results  Component Value Date   TRIG 215.0 (H) 05/29/2015   Lab Results  Component Value Date   CHOLHDL 4 05/29/2015   ASSESSMENT AND PLAN:   Health maintenance exam: Reviewed age and gender appropriate health maintenance issues (prudent diet, regular exercise, health risks of tobacco and excessive alcohol, use of seatbelts, fire alarms in home, use of sunscreen).  Also reviewed age and gender appropriate health screening as well as vaccine recommendations. Fasting HP labs today. Tdap today.  An After Visit Summary was printed and given to the patient.  FOLLOW UP:  Return in about 1 year (around 12/24/2017) for annual CPE (fasting).  Signed:  Santiago Bumpers, MD            12/24/2016

## 2017-06-26 ENCOUNTER — Encounter: Payer: Self-pay | Admitting: *Deleted

## 2017-06-26 ENCOUNTER — Telehealth: Payer: Self-pay | Admitting: Family Medicine

## 2017-06-26 NOTE — Telephone Encounter (Signed)
Please mail a copy of bloodwork results to patient.

## 2017-06-26 NOTE — Telephone Encounter (Signed)
Called pt to verify this was okay. Pt gave okay and confirmed PO Box. Results put in box up front to be mailed out.

## 2017-09-16 ENCOUNTER — Encounter: Payer: Self-pay | Admitting: Family Medicine

## 2017-10-19 ENCOUNTER — Ambulatory Visit: Payer: BLUE CROSS/BLUE SHIELD | Admitting: Family Medicine

## 2017-10-19 ENCOUNTER — Encounter: Payer: Self-pay | Admitting: Family Medicine

## 2017-10-19 VITALS — BP 135/86 | HR 77 | Temp 97.6°F | Resp 20 | Wt 220.5 lb

## 2017-10-19 DIAGNOSIS — B353 Tinea pedis: Secondary | ICD-10-CM

## 2017-10-19 MED ORDER — TERBINAFINE HCL 250 MG PO TABS: 250.0000 mg | ORAL_TABLET | Freq: Every day | ORAL | 0 refills | Status: DC

## 2017-10-19 MED ORDER — CICLOPIROX OLAMINE 0.77 % EX CREA: TOPICAL_CREAM | Freq: Two times a day (BID) | CUTANEOUS | 0 refills | Status: DC

## 2017-10-19 NOTE — Progress Notes (Signed)
Geoffrey Contreras , [birth year], 37 y.o., male MRN: [id number] Patient Care Team    Relationship Specialty Notifications Start End  McGowen, Maryjean MornPhilip H, MD PCP - General Family Medicine  05/29/15    Comment: patient wants to transfer to [facility]    Chief Complaint  Patient presents with  . Tinea Pedis    x 3 weeks over counter med not helping     Subjective: Pt presents for an OV with complaints of athlete's foot of 3 weeks duration.  Associated symptoms include itching, burning, skin peeling. Patient reports he suffers dramatically see at least once a year. He usually is able to use over-the-counter spray and creams alone. This time however relates that has not responded completely to the creams and sprays. He states it is improving but now it's starting on the other foot as well, and it usually does not take a small to resolve.  Depression screen [facility] 2/9 12/24/2016  Decreased Interest 0  Down, Depressed, Hopeless 0  PHQ - 2 Score 0    Allergies  Allergen Reactions  . Penicillins Rash   Social History   Tobacco Use  . Smoking status: Former Smoker    Packs/day: 1.00    Years: 11.00    Pack years: 11.00    Types: Cigarettes    Start date: 11/11/1995    Last attempt to quit: 09/06/2007    Years since quitting: 10.1  . Smokeless tobacco: Never Used  . Tobacco comment: QUIT 7 YEARS AGO  Substance Use Topics  . Alcohol use: Yes    Alcohol/week: 1.2 - 1.8 oz    Types: 2 - 3 Standard drinks or equivalent per week    Comment: only on weeknds   Past Medical History:  Diagnosis Date  . Allergic rhinitis   . Cervical radiculopathy at C7 2014   transient numbness R arm when working hard Delbert Harness(Murphy Wainer did w/u and offered spinal steroid injection but pt chose to decline this).  . Hypertriglyceridemia 05/2015; 12/2016   Mild, with mildly low HDL (no meds; TLC recommended)  . Leukopenia 2016   Transient (saw Dr. Myna HidalgoEnnever and was released/reassured)  . Obesity, Class I, BMI 30-34.9     Past Surgical History:  Procedure Laterality Date  . NO PAST SURGERIES     Family History  Problem Relation Age of Onset  . Breast cancer Mother 2655  . Diabetes Mother        ??  . Cancer Maternal Grandmother        Lung  . Colon cancer Neg Hx   . Prostate cancer Neg Hx   . CAD Neg Hx    Allergies as of 10/19/2017      Reactions   Penicillins Rash      Medication List        Accurate as of 10/19/17 12:25 PM. Always use your most recent med list.          ciclopirox 0.77 % cream Commonly known as:  LOPROX Apply topically 2 (two) times daily.   terbinafine 250 MG tablet Commonly known as:  LAMISIL Take 1 tablet (250 mg total) by mouth daily.       All past medical history, surgical history, allergies, family history, immunizations andmedications were updated in the EMR today and reviewed under the history and medication portions of their EMR.     ROS: Negative, with the exception of above mentioned in HPI   Objective:  BP 135/86 (BP Location: Right Arm,  Patient Position: Sitting, Cuff Size: Large)   Pulse 77   Temp 97.6 F (36.4 C)   Resp 20   Wt 220 lb 8 oz (100 kg)   SpO2 97%   BMI 33.04 kg/m  Body mass index is 33.04 kg/m. Gen: Afebrile. No acute distress. Nontoxic in appearance, well developed, well nourished.  Skin: Peeling, red rash right foot arch to the bottom of large toe. Small similar area on left foot. no purpura or petechiae.  Neuro: Normal gait. PERLA. EOMi. Alert. Oriented x3   No exam data present No results found. No results found for this or any previous visit (from the past 24 hour(s)).  Assessment/Plan: KIMARI COUDRIET is a 37 y.o. male present for OV for  Tinea pedis, unspecified laterality Continue to treat shoes with antifungal spray. Patient instructed to not reuse towels, clean shower and bathroom floors. Frequently change socks. Start Loprox cream twice a day for 2-4 weeks. If not seeing good improvement after 1 week may  add terbinafine 250 mg daily for 14 days. terbinafine was prescribed in printed for him. LFT are normal.  Follow-up 4 weeks when necessary    Reviewed expectations re: course of current medical issues.  Discussed self-management of symptoms.  Outlined signs and symptoms indicating need for more acute intervention.  Patient verbalized understanding and all questions were answered.  Patient received an After-Visit Summary.    No orders of the defined types were placed in this encounter.    Note is dictated utilizing voice recognition software. Although note has been proof read prior to signing, occasional typographical errors still can be missed. If any questions arise, please do not hesitate to call for verification.   electronically signed by:  Felix Pacini, DO  [facility]

## 2017-10-19 NOTE — Patient Instructions (Signed)
Start new cream twice a day, if not improving in a week, start the oral medication once  A day as well.  If responding to cream, continue cream alone for 2-4 weeks.  Continue spraying shoes, changing socks.  Cleaning Bathroom/shower floors.  Never reuse a towel.    Athlete's Foot Athlete's foot (tinea pedis) is a fungal infection of the skin on the feet. It often occurs on the skin that is between or underneath the toes. It can also occur on the soles of the feet. The infection can spread from person to person (is contagious). What are the causes? Athlete's foot is caused by a fungus. This fungus grows in warm, moist places. Most people get athlete's foot by sharing shower stalls, towels, and wet floors with someone who is infected. Not washing your feet or changing your socks often enough can contribute to athlete's foot. What increases the risk? This condition is more likely to develop in:  Men.  People who have a weak body defense system (immune system).  People who have diabetes.  People who use public showers, such as at a gym.  People who wear heavy-duty shoes, such as Youth worker.  Seasons with warm, humid weather.  What are the signs or symptoms? Symptoms of this condition include:  Itchy areas between the toes or on the soles of the feet.  White, flaky, or scaly areas between the toes or on the soles of the feet.  Very itchy small blisters between the toes or on the soles of the feet.  Small cuts on the skin. These cuts can become infected.  Thick or discolored toenails.  How is this diagnosed? This condition is diagnosed with a medical history and physical exam. Your health care provider may also take a skin or toenail sample to be examined. How is this treated? Treatment for this condition includes antifungal medicines. These may be applied as powders, ointments, or creams. In severe cases, an oral antifungal medicine may be given. Follow these  instructions at home:  Apply or take over-the-counter and prescription medicines only as told by your health care provider.  Keep all follow-up visits as told by your health care provider. This is important.  Do not scratch your feet.  Keep your feet dry: ? Wear cotton or wool socks. Change your socks every day or if they become wet. ? Wear shoes that allow air to circulate, such as sandals or canvas tennis shoes.  Wash and dry your feet: ? Every day or as told by your health care provider. ? After exercising. ? Including the area between your toes.  Do not share towels, nail clippers, or other personal items that touch your feet with others.  If you have diabetes, keep your blood sugar under control. How is this prevented?  Do not share towels.  Wear sandals in wet areas, such as locker rooms and shared showers.  Keep your feet dry: ? Wear cotton or wool socks. Change your socks every day or if they become wet. ? Wear shoes that allow air to circulate, such as sandals or canvas tennis shoes.  Wash and dry your feet after exercising. Pay attention to the area between your toes. Contact a health care provider if:  You have a fever.  You have swelling, soreness, warmth, or redness in your foot.  You are not getting better with treatment.  Your symptoms get worse.  You have new symptoms. This information is not intended to replace advice given  to you by your health care provider. Make sure you discuss any questions you have with your health care provider. Document Released: 08/22/2000 Document Revised: 01/31/2016 Document Reviewed: 02/26/2015 Elsevier Interactive Patient Education  2018 [employer]

## 2017-10-28 ENCOUNTER — Other Ambulatory Visit: Payer: Self-pay | Admitting: *Deleted

## 2017-10-28 MED ORDER — CICLOPIROX OLAMINE 0.77 % EX CREA: TOPICAL_CREAM | Freq: Two times a day (BID) | CUTANEOUS | 0 refills | Status: DC

## 2017-12-25 ENCOUNTER — Encounter: Payer: BLUE CROSS/BLUE SHIELD | Admitting: Family Medicine

## 2018-01-05 ENCOUNTER — Ambulatory Visit (INDEPENDENT_AMBULATORY_CARE_PROVIDER_SITE_OTHER): Payer: BLUE CROSS/BLUE SHIELD | Admitting: Family Medicine

## 2018-01-05 ENCOUNTER — Encounter: Payer: Self-pay | Admitting: Family Medicine

## 2018-01-05 VITALS — BP 122/85 | HR 96 | Temp 97.4°F | Resp 16 | Ht 68.25 in | Wt 214.0 lb

## 2018-01-05 DIAGNOSIS — Z0000 Encounter for general adult medical examination without abnormal findings: Secondary | ICD-10-CM

## 2018-01-05 LAB — CBC WITH DIFFERENTIAL/PLATELET
BASOS PCT: 0.5 % (ref 0.0–3.0)
Basophils Absolute: 0 10*3/uL (ref 0.0–0.1)
Eosinophils Absolute: 0.1 10*3/uL (ref 0.0–0.7)
Eosinophils Relative: 0.9 % (ref 0.0–5.0)
HCT: 43.5 % (ref 39.0–52.0)
HEMOGLOBIN: 14.6 g/dL (ref 13.0–17.0)
LYMPHS ABS: 2.6 10*3/uL (ref 0.7–4.0)
Lymphocytes Relative: 44.1 % (ref 12.0–46.0)
MCHC: 33.5 g/dL (ref 30.0–36.0)
MCV: 88.6 fl (ref 78.0–100.0)
MONO ABS: 0.4 10*3/uL (ref 0.1–1.0)
MONOS PCT: 7.4 % (ref 3.0–12.0)
NEUTROS PCT: 47.1 % (ref 43.0–77.0)
Neutro Abs: 2.7 10*3/uL (ref 1.4–7.7)
Platelets: 185 10*3/uL (ref 150.0–400.0)
RBC: 4.91 Mil/uL (ref 4.22–5.81)
RDW: 13.1 % (ref 11.5–15.5)
WBC: 5.8 10*3/uL (ref 4.0–10.5)

## 2018-01-05 LAB — LIPID PANEL
CHOL/HDL RATIO: 4
Cholesterol: 166 mg/dL (ref 0–200)
HDL: 39.2 mg/dL (ref >39.00)
LDL Cholesterol: 88 mg/dL (ref 0–99)
NONHDL: 127.14
Triglycerides: 197 mg/dL — ABNORMAL HIGH (ref 0.0–149.0)
VLDL: 39.4 mg/dL (ref 0.0–40.0)

## 2018-01-05 LAB — COMPREHENSIVE METABOLIC PANEL
ALT: 12 U/L (ref 0–53)
AST: 13 U/L (ref 0–37)
Albumin: 4.1 g/dL (ref 3.5–5.2)
Alkaline Phosphatase: 44 U/L (ref 39–117)
BUN: 12 mg/dL (ref 6–23)
CO2: 28 meq/L (ref 19–32)
Calcium: 9.2 mg/dL (ref 8.4–10.5)
Chloride: 106 mEq/L (ref 96–112)
Creatinine, Ser: 0.95 mg/dL (ref 0.40–1.50)
GFR: 95.21 mL/min (ref >60.00)
GLUCOSE: 87 mg/dL (ref 70–99)
POTASSIUM: 4.1 meq/L (ref 3.5–5.1)
SODIUM: 141 meq/L (ref 135–145)
Total Bilirubin: 0.6 mg/dL (ref 0.2–1.2)
Total Protein: 6.7 g/dL (ref 6.0–8.3)

## 2018-01-05 NOTE — Progress Notes (Signed)
Office Note 01/05/2018  CC:  Chief Complaint  Patient presents with  . Annual Exam    HPI:  Geoffrey Contreras is a 37 y.o. White male who is here for annual health maintenance exam.  Exercise: active with job but no formal regimen. Diet: not healthy b/c "always on the go". Dental preventatives UTD.   Past Medical History:  Diagnosis Date  . Allergic rhinitis   . Cervical radiculopathy at C7 2014   transient numbness R arm when working hard Delbert Harness did w/u and offered spinal steroid injection but pt chose to decline this).  . Hypertriglyceridemia 05/2015; 12/2016   Mild, with mildly low HDL (no meds; TLC recommended)  . Leukopenia 2016   Transient (saw Dr. Myna Hidalgo and was released/reassured)  . Obesity, Class I, BMI 30-34.9     Past Surgical History:  Procedure Laterality Date  . NO PAST SURGERIES      Family History  Problem Relation Age of Onset  . Breast cancer Mother 12  . Diabetes Mother        ??  . Cancer Maternal Grandmother        Lung  . Colon cancer Neg Hx   . Prostate cancer Neg Hx   . CAD Neg Hx     Social History   Socioeconomic History  . Marital status: Married    Spouse name: Florentina Addison  . Number of children: 1  . Years of education: 12th  . Highest education level: Not on file  Occupational History  . Occupation: Chief of Staff; Radiographer, therapeutic  Social Needs  . Financial resource strain: Not on file  . Food insecurity:    Worry: Not on file    Inability: Not on file  . Transportation needs:    Medical: Not on file    Non-medical: Not on file  Tobacco Use  . Smoking status: Former Smoker    Packs/day: 1.00    Years: 11.00    Pack years: 11.00    Types: Cigarettes    Start date: 11/11/1995    Last attempt to quit: 09/06/2007    Years since quitting: 10.3  . Smokeless tobacco: Never Used  . Tobacco comment: QUIT 7 YEARS AGO  Substance and Sexual Activity  . Alcohol use: Yes    Alcohol/week: 1.2 - 1.8 oz    Types: 2 - 3 Standard  drinks or equivalent per week    Comment: only on weeknds  . Drug use: No  . Sexual activity: Not on file  Lifestyle  . Physical activity:    Days per week: Not on file    Minutes per session: Not on file  . Stress: Not on file  Relationships  . Social connections:    Talks on phone: Not on file    Gets together: Not on file    Attends religious service: Not on file    Active member of club or organization: Not on file    Attends meetings of clubs or organizations: Not on file    Relationship status: Not on file  . Intimate partner violence:    Fear of current or ex partner: Not on file    Emotionally abused: Not on file    Physically abused: Not on file    Forced sexual activity: Not on file  Other Topics Concern  . Not on file  Social History Narrative   Lives with his wife and their son.   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  Orig from [place], [place].   No tob, occ alcohol.  No hx of alc or drug abuse.    Outpatient Medications Prior to Visit  Medication Sig Dispense Refill  . ciclopirox (LOPROX) 0.77 % cream Apply topically 2 (two) times daily. (Patient not taking: Reported on 01/05/2018) 15 g 0  . terbinafine (LAMISIL) 250 MG tablet Take 1 tablet (250 mg total) by mouth daily. (Patient not taking: Reported on 01/05/2018) 14 tablet 0   No facility-administered medications prior to visit.     Allergies  Allergen Reactions  . Penicillins Rash    ROS Review of Systems  PE; Blood pressure 122/85, pulse 96, temperature (!) 97.4 F (36.3 C), temperature source Oral, resp. rate 16, height 5' 8.25" (1.734 m), weight 214 lb (97.1 kg), SpO2 96 %. Body mass index is 32.3 kg/m.  Gen: Alert, well appearing.  Patient is oriented to person, place, time, and situation. AFFECT: pleasant, lucid thought and speech. ENT: Ears: EACs clear, normal epithelium.  TMs with good light reflex and landmarks bilaterally.  Eyes: no injection, icteris, swelling, or  exudate.  EOMI, PERRLA. Nose: no drainage or turbinate edema/swelling.  No injection or focal lesion.  Mouth: lips without lesion/swelling.  Oral mucosa pink and moist.  Dentition intact and without obvious caries or gingival swelling.  Oropharynx without erythema, exudate, or swelling.  Neck: supple/nontender.  No LAD, mass, or TM.  Carotid pulses 2+ bilaterally, without bruits. CV: RRR, no m/r/g.   LUNGS: CTA bilat, nonlabored resps, good aeration in all lung fields. ABD: soft, NT, ND, BS normal.  No hepatospenomegaly or mass.  No bruits. EXT: no clubbing, cyanosis, or edema.  Musculoskeletal: no joint swelling, erythema, warmth, or tenderness.  ROM of all joints intact. Skin - no sores or suspicious lesions or rashes or color changes   Pertinent labs:  Lab Results  Component Value Date   TSH 1.96 12/24/2016   Lab Results  Component Value Date   WBC 8.1 12/24/2016   HGB 15.1 12/24/2016   HCT 45.1 12/24/2016   MCV 88.7 12/24/2016   PLT 202.0 12/24/2016   Lab Results  Component Value Date   CREATININE 1.04 12/24/2016   BUN 12 12/24/2016   NA 138 12/24/2016   K 4.5 12/24/2016   CL 104 12/24/2016   CO2 27 12/24/2016   Lab Results  Component Value Date   ALT 19 12/24/2016   AST 17 12/24/2016   ALKPHOS 47 12/24/2016   BILITOT 0.6 12/24/2016   Lab Results  Component Value Date   CHOL 192 12/24/2016   Lab Results  Component Value Date   HDL 44.60 12/24/2016   Lab Results  Component Value Date   LDLCALC 83 03/30/2014   Lab Results  Component Value Date   TRIG 264.0 (H) 12/24/2016   Lab Results  Component Value Date   CHOLHDL 4 12/24/2016     ASSESSMENT AND PLAN:   Health maintenance exam: Reviewed age and gender appropriate health maintenance issues (prudent diet, regular exercise, health risks of tobacco and excessive alcohol, use of seatbelts, fire alarms in home, use of sunscreen).  Also reviewed age and gender appropriate health screening as well as  vaccine recommendations. Vaccines: UTD. Labs: fasting HP today. Colon ca screening: average risk patient= start screening at age 5 yrs. Prostate ca screening: average risk patient= start screening at age 59 yrs.  An After Visit Summary was printed and given to the patient.  FOLLOW UP:  Return in about 1 year (around  01/06/2019) for annual CPE (fasting).  Signed:  Santiago Bumpers, MD           01/05/2018

## 2018-01-05 NOTE — Patient Instructions (Signed)

## 2018-01-06 LAB — TSH: TSH: 1.52 u[IU]/mL (ref 0.35–4.50)

## 2018-01-11 ENCOUNTER — Encounter: Payer: Self-pay | Admitting: Family Medicine

## 2018-01-11 ENCOUNTER — Telehealth: Payer: Self-pay | Admitting: Family Medicine

## 2018-01-11 NOTE — Telephone Encounter (Signed)
Pt given results per notes of Dr. Milinda Cave on 01/11/18. Pt verbalized understanding.Unable to document in result note due to result note not being routed to [facility].

## 2018-01-11 NOTE — Telephone Encounter (Signed)
Copied from CRM [id number]. Topic: Quick Communication - See Telephone Encounter >> Jan 11, 2018  4:35 PM Arlyss Gandy, NT wrote: CRM for notification. See Telephone encounter for: 01/11/18. Pt calling back to get lab results.

## 2018-01-13 DIAGNOSIS — H52203 Unspecified astigmatism, bilateral: Secondary | ICD-10-CM | POA: Diagnosis not present

## 2018-02-16 ENCOUNTER — Other Ambulatory Visit: Payer: Self-pay

## 2018-02-16 ENCOUNTER — Emergency Department (HOSPITAL_BASED_OUTPATIENT_CLINIC_OR_DEPARTMENT_OTHER)
Admission: EM | Admit: 2018-02-16 | Discharge: 2018-02-16 | Disposition: A | Discharge status: Home / Self Care | Payer: BLUE CROSS/BLUE SHIELD | Attending: Emergency Medicine | Admitting: Emergency Medicine

## 2018-02-16 ENCOUNTER — Encounter (HOSPITAL_BASED_OUTPATIENT_CLINIC_OR_DEPARTMENT_OTHER): Payer: Self-pay

## 2018-02-16 DIAGNOSIS — W458XXA Other foreign body or object entering through skin, initial encounter: Secondary | ICD-10-CM | POA: Diagnosis not present

## 2018-02-16 DIAGNOSIS — Y929 Unspecified place or not applicable: Secondary | ICD-10-CM | POA: Diagnosis not present

## 2018-02-16 DIAGNOSIS — Z87891 Personal history of nicotine dependence: Secondary | ICD-10-CM | POA: Insufficient documentation

## 2018-02-16 DIAGNOSIS — Z23 Encounter for immunization: Secondary | ICD-10-CM | POA: Diagnosis not present

## 2018-02-16 DIAGNOSIS — S60459A Superficial foreign body of unspecified finger, initial encounter: Secondary | ICD-10-CM

## 2018-02-16 DIAGNOSIS — Y998 Other external cause status: Secondary | ICD-10-CM | POA: Diagnosis not present

## 2018-02-16 DIAGNOSIS — S60551A Superficial foreign body of right hand, initial encounter: Secondary | ICD-10-CM | POA: Diagnosis not present

## 2018-02-16 DIAGNOSIS — Y939 Activity, unspecified: Secondary | ICD-10-CM | POA: Insufficient documentation

## 2018-02-16 DIAGNOSIS — S61441A Puncture wound with foreign body of right hand, initial encounter: Secondary | ICD-10-CM | POA: Diagnosis not present

## 2018-02-16 MED ORDER — TETANUS-DIPHTH-ACELL PERTUSSIS 5-2.5-18.5 LF-MCG/0.5 IM SUSP
0.5000 mL | Freq: Once | INTRAMUSCULAR | Status: AC
  Administered 2018-02-16: 0.5 mL via INTRAMUSCULAR
  Filled 2018-02-16: qty 0.5

## 2018-02-16 MED ORDER — CEPHALEXIN 500 MG PO CAPS: 500.0000 mg | ORAL_CAPSULE | Freq: Three times a day (TID) | ORAL | 0 refills | Status: DC

## 2018-02-16 MED ORDER — LIDOCAINE HCL (PF) 2 % IJ SOLN
INTRAMUSCULAR | Status: AC
  Filled 2018-02-16: qty 4

## 2018-02-16 MED ORDER — LIDOCAINE HCL 2 % IJ SOLN
5.0000 mL | Freq: Once | INTRAMUSCULAR | Status: AC
  Administered 2018-02-16: 100 mg
  Filled 2018-02-16: qty 10

## 2018-02-16 MED ORDER — CEPHALEXIN 250 MG PO CAPS
500.0000 mg | ORAL_CAPSULE | Freq: Once | ORAL | Status: AC
  Administered 2018-02-16: 500 mg via ORAL
  Filled 2018-02-16: qty 2

## 2018-02-16 MED ORDER — ACETAMINOPHEN 500 MG PO TABS
1000.0000 mg | ORAL_TABLET | Freq: Once | ORAL | Status: AC
  Administered 2018-02-16: 1000 mg via ORAL
  Filled 2018-02-16: qty 2

## 2018-02-16 NOTE — ED Triage Notes (Signed)
Pt with fishing hook in right hand x 30 min PTA-NAD-steady gait

## 2018-02-16 NOTE — ED Provider Notes (Signed)
[facility] EMERGENCY DEPARTMENT Provider Note   CSN: [id number] Arrival date & time: 02/16/18  2013     History   Chief Complaint Chief Complaint  Patient presents with  . Foreign Body in Skin    HPI Geoffrey Contreras is a 37 y.o. male.  The history is provided by the patient.  He was fishing today when he got a fish hook impaled in his right hand. He does not know when his last tetanus shot was.  Past Medical History:  Diagnosis Date  . Allergic rhinitis   . Cervical radiculopathy at C7 2014   transient numbness R arm when working hard Delbert Harness(Murphy Wainer did w/u and offered spinal steroid injection but pt chose to decline this).  . Hypertriglyceridemia 05/2015; 12/2016; 01/2018   Mild, with mildly low HDL (no meds; TLC recommended)  . Leukopenia 2016   Transient (saw Dr. Myna HidalgoEnnever and was released/reassured)  . Obesity, Class I, BMI 30-34.9     Patient Active Problem List   Diagnosis Date Noted  . Health maintenance examination 05/29/2015  . Annual physical exam 02/28/2014  . Skin lesion and mole 02/28/2014  . Allergic rhinitis     Past Surgical History:  Procedure Laterality Date  . NO PAST SURGERIES          Home Medications    Prior to Admission medications   Medication Sig Start Date End Date Taking? Authorizing Provider  cephALEXin (KEFLEX) 500 MG capsule Take 1 capsule (500 mg total) by mouth 3 (three) times daily. 02/16/18   Dione BoozeGlick, Layaan Mott, MD    Family History Family History  Problem Relation Age of Onset  . Breast cancer Mother 1155  . Diabetes Mother        ??  . Cancer Maternal Grandmother        Lung  . Colon cancer Neg Hx   . Prostate cancer Neg Hx   . CAD Neg Hx     Social History Social History   Tobacco Use  . Smoking status: Former Smoker    Packs/day: 1.00    Years: 11.00    Pack years: 11.00    Types: Cigarettes    Start date: 11/11/1995    Last attempt to quit: 09/06/2007    Years since quitting: 10.4  . Smokeless  tobacco: Never Used  . Tobacco comment: QUIT 7 YEARS AGO  Substance Use Topics  . Alcohol use: Yes    Alcohol/week: 1.2 - 1.8 oz    Types: 2 - 3 Standard drinks or equivalent per week    Comment: only on weeknds  . Drug use: No     Allergies   Penicillins   Review of Systems Review of Systems  All other systems reviewed and are negative.    Physical Exam Updated Vital Signs BP 119/63 (BP Location: Left Arm)   Pulse 64   Temp 98.4 F (36.9 C) (Oral)   Resp 16   Ht 5\' 7"  (1.702 m)   Wt 97.8 kg (215 lb 11.2 oz)   SpO2 97%   BMI 33.78 kg/m   Physical Exam  Nursing note and vitals reviewed.  37 year old male, resting comfortably and in no acute distress. Vital signs are normal. Oxygen saturation is 97%, which is normal. Head is normocephalic and atraumatic. PERRLA, EOMI. Oropharynx is clear. Neck is nontender and supple without adenopathy or JVD. Back is nontender and there is no CVA tenderness. Lungs are clear without rales, wheezes, or rhonchi. Chest is  nontender. Heart has regular rate and rhythm without murmur. Abdomen is soft, flat, nontender without masses or hepatosplenomegaly and peristalsis is normoactive. Extremities: Large fish hook imbedded in the thenar eminence of the right hand. Skin is warm and dry without rash. Neurologic: Mental status is normal, cranial nerves are intact, there are no motor or sensory deficits.  ED Treatments / Results   Procedures .Foreign Body Removal Date/Time: 02/16/2018 11:00 PM Performed by: Dione Booze, MD Authorized by: Dione Booze, MD  Consent: Verbal consent obtained. Written consent not obtained. Risks and benefits: risks, benefits and alternatives were discussed Consent given by: patient Patient understanding: patient states understanding of the procedure being performed Patient consent: the patient's understanding of the procedure matches consent given Procedure consent: procedure consent matches procedure  scheduled Relevant documents: relevant documents present and verified Test results: test results available and properly labeled Site marked: the operative site was marked Imaging studies: imaging studies not available Required items: required blood products, implants, devices, and special equipment available Patient identity confirmed: verbally with patient and arm band Time out: Immediately prior to procedure a "time out" was called to verify the correct patient, procedure, equipment, support staff and site/side marked as required. Body area: skin General location: upper extremity Location details: right hand Anesthesia: local infiltration  Anesthesia: Local Anesthetic: lidocaine 2% without epinephrine Anesthetic total: 3 mL  Sedation: Patient sedated: no  Patient restrained: no Patient cooperative: yes Localization method: visualized Removal mechanism: manipulated using 18 guage needle to cover barb of the hook. Complexity: simple 1 objects recovered. Objects recovered: fish hook Post-procedure assessment: foreign body removed Patient tolerance: Patient tolerated the procedure well with no immediate complications     Medications Ordered in ED Medications  lidocaine (XYLOCAINE) 2 % (with pres) injection 100 mg (has no administration in time range)  cephALEXin (KEFLEX) capsule 500 mg (has no administration in time range)  acetaminophen (TYLENOL) tablet 1,000 mg (1,000 mg Oral Given 02/16/18 2243)  Tdap (BOOSTRIX) injection 0.5 mL (0.5 mLs Intramuscular Given 02/16/18 2244)     Initial Impression / Assessment and Plan / ED Course  I have reviewed the triage vital signs and the nursing notes.  Fish hook in right hand removed after local anesthesia with lidocaine. Tdap given at triage. Discharged with prescription for cephalexin.  Final Clinical Impressions(s) / ED Diagnoses   Final diagnoses:  Foreign body of finger of right hand, initial encounter    ED Discharge  Orders        Ordered    cephALEXin (KEFLEX) 500 MG capsule  3 times daily     02/16/18 2310       Dione Booze, MD 02/16/18 2317

## 2018-02-16 NOTE — Discharge Instructions (Addendum)
Soak in warm water three times a day for the next three days.

## 2018-02-16 NOTE — ED Notes (Signed)
ED Provider at bedside to remove fish hook

## 2018-04-13 ENCOUNTER — Encounter: Payer: Self-pay | Admitting: Family Medicine

## 2018-04-13 ENCOUNTER — Ambulatory Visit: Payer: BLUE CROSS/BLUE SHIELD | Admitting: Family Medicine

## 2018-04-13 VITALS — BP 113/74 | HR 52 | Temp 97.6°F | Resp 16 | Ht 67.0 in | Wt 214.5 lb

## 2018-04-13 DIAGNOSIS — L255 Unspecified contact dermatitis due to plants, except food: Secondary | ICD-10-CM

## 2018-04-13 MED ORDER — METHYLPREDNISOLONE ACETATE 80 MG/ML IJ SUSP
80.0000 mg | Freq: Once | INTRAMUSCULAR | Status: AC
  Administered 2018-04-13: 80 mg via INTRAMUSCULAR

## 2018-04-13 MED ORDER — FLUTICASONE PROPIONATE 0.05 % EX CREA: TOPICAL_CREAM | Freq: Two times a day (BID) | CUTANEOUS | 1 refills | Status: DC

## 2018-04-13 NOTE — Patient Instructions (Signed)
You can stop applying neosporin.  Start the topical steroid I sent to your pharmacy today.  Take a generic over the counter antihistamine like allegra or zyrtec as directed on the packaging AS NEEDED TO HELP ITCHING.

## 2018-04-13 NOTE — Progress Notes (Signed)
OFFICE VISIT  04/13/2018   CC:  Chief Complaint  Patient presents with  . Rash    Poison Ivy?   HPI:    Patient is a 37 y.o. Caucasian male who presents for rash. Onsert 4-5 d/a, itchy rash on R hand, then R side of neck, back of neck, a little on L hand now. He did a lot of yard work about 2 d prior to onset of itchy rash.  Put some neosporin on it. No antihistamine.  Has had similar rash in remote past after exposure to poison ivy/oak.  No f/c/malaise.  No oral lesions.  Eyes normal.  No swelling of lips, tongue, eyes, throat.  No wheezing or SOB.  Past Medical History:  Diagnosis Date  . Allergic rhinitis   . Cervical radiculopathy at C7 2014   transient numbness R arm when working hard Delbert Harness(Murphy Wainer did w/u and offered spinal steroid injection but pt chose to decline this).  . Hypertriglyceridemia 05/2015; 12/2016; 01/2018   Mild, with mildly low HDL (no meds; TLC recommended)  . Leukopenia 2016   Transient (saw Dr. Myna HidalgoEnnever and was released/reassured)  . Obesity, Class I, BMI 30-34.9     Past Surgical History:  Procedure Laterality Date  . NO PAST SURGERIES      Outpatient Medications Prior to Visit  Medication Sig Dispense Refill  . cephALEXin (KEFLEX) 500 MG capsule Take 1 capsule (500 mg total) by mouth 3 (three) times daily. (Patient not taking: Reported on 04/13/2018) 15 capsule 0   No facility-administered medications prior to visit.     Allergies  Allergen Reactions  . Penicillins Rash    ROS As per HPI  PE: Blood pressure 113/74, pulse (!) 52, temperature 97.6 F (36.4 C), temperature source Oral, resp. rate 16, height 5\' 7"  (1.702 m), weight 214 lb 8 oz (97.3 kg), SpO2 96 %. Gen: Alert, well appearing.  Patient is oriented to person, place, time, and situation. AFFECT: pleasant, lucid thought and speech. SKIN: erythematous papular rash on hands, R>>L--a few of the areas look like a vesicle has been ruptured. Small splotch of similar rash on R side  of neck and back of neck.  LABS:  none  IMPRESSION AND PLAN:  Contact dermatitis due to poison ivy or poison oak or poison sumac. Depo medrol 80mg  IM today. Cutivate 0.05% cream bid to affected areas. Instructions: You can stop applying neosporin.  Start the topical steroid I sent to your pharmacy today.  Take a generic over the counter antihistamine like allegra or zyrtec as directed on the packaging AS NEEDED TO HELP ITCHING.  An After Visit Summary was printed and given to the patient.  FOLLOW UP: Return if symptoms worsen or fail to improve.  Signed:  Santiago BumpersPhil Zakyah Yanes, MD           04/13/2018

## 2018-07-27 ENCOUNTER — Ambulatory Visit (INDEPENDENT_AMBULATORY_CARE_PROVIDER_SITE_OTHER): Payer: BLUE CROSS/BLUE SHIELD

## 2018-07-27 DIAGNOSIS — Z23 Encounter for immunization: Secondary | ICD-10-CM

## 2019-02-25 ENCOUNTER — Telehealth: Payer: Self-pay

## 2019-02-25 NOTE — Telephone Encounter (Signed)
Wrong pt

## 2019-02-25 NOTE — Telephone Encounter (Signed)
  This was written on wrong patient I think

## 2019-03-31 ENCOUNTER — Encounter: Payer: BLUE CROSS/BLUE SHIELD | Admitting: Family Medicine

## 2019-07-22 ENCOUNTER — Other Ambulatory Visit: Payer: Self-pay

## 2019-07-22 DIAGNOSIS — Z20822 Contact with and (suspected) exposure to covid-19: Secondary | ICD-10-CM

## 2019-07-25 LAB — NOVEL CORONAVIRUS, NAA: SARS-CoV-2, NAA: DETECTED — AB

## 2019-10-20 DIAGNOSIS — Z23 Encounter for immunization: Secondary | ICD-10-CM | POA: Diagnosis not present

## 2019-11-18 DIAGNOSIS — Z23 Encounter for immunization: Secondary | ICD-10-CM | POA: Diagnosis not present

## 2019-12-13 ENCOUNTER — Encounter: Payer: Self-pay | Admitting: Family Medicine

## 2019-12-13 ENCOUNTER — Other Ambulatory Visit: Payer: Self-pay

## 2019-12-13 ENCOUNTER — Ambulatory Visit (INDEPENDENT_AMBULATORY_CARE_PROVIDER_SITE_OTHER): Payer: BC Managed Care – PPO | Admitting: Family Medicine

## 2019-12-13 VITALS — BP 139/83 | HR 55 | Temp 98.0°F | Resp 16 | Ht 67.0 in | Wt 217.6 lb

## 2019-12-13 DIAGNOSIS — E781 Pure hyperglyceridemia: Secondary | ICD-10-CM

## 2019-12-13 DIAGNOSIS — E669 Obesity, unspecified: Secondary | ICD-10-CM | POA: Diagnosis not present

## 2019-12-13 DIAGNOSIS — Z0000 Encounter for general adult medical examination without abnormal findings: Secondary | ICD-10-CM

## 2019-12-13 LAB — CBC WITH DIFFERENTIAL/PLATELET
Basophils Absolute: 0 10*3/uL (ref 0.0–0.1)
Basophils Relative: 0.6 % (ref 0.0–3.0)
Eosinophils Absolute: 0.1 10*3/uL (ref 0.0–0.7)
Eosinophils Relative: 1.6 % (ref 0.0–5.0)
HCT: 44.9 % (ref 39.0–52.0)
Hemoglobin: 15 g/dL (ref 13.0–17.0)
Lymphocytes Relative: 47 % — ABNORMAL HIGH (ref 12.0–46.0)
Lymphs Abs: 3.3 10*3/uL (ref 0.7–4.0)
MCHC: 33.5 g/dL (ref 30.0–36.0)
MCV: 88.7 fl (ref 78.0–100.0)
Monocytes Absolute: 0.5 10*3/uL (ref 0.1–1.0)
Monocytes Relative: 7.6 % (ref 3.0–12.0)
Neutro Abs: 3 10*3/uL (ref 1.4–7.7)
Neutrophils Relative %: 43.2 % (ref 43.0–77.0)
Platelets: 182 10*3/uL (ref 150.0–400.0)
RBC: 5.07 Mil/uL (ref 4.22–5.81)
RDW: 13.3 % (ref 11.5–15.5)
WBC: 7 10*3/uL (ref 4.0–10.5)

## 2019-12-13 LAB — COMPREHENSIVE METABOLIC PANEL
ALT: 16 U/L (ref 0–53)
AST: 14 U/L (ref 0–37)
Albumin: 4.2 g/dL (ref 3.5–5.2)
Alkaline Phosphatase: 49 U/L (ref 39–117)
BUN: 12 mg/dL (ref 6–23)
CO2: 26 mEq/L (ref 19–32)
Calcium: 9.1 mg/dL (ref 8.4–10.5)
Chloride: 104 mEq/L (ref 96–112)
Creatinine, Ser: 0.9 mg/dL (ref 0.40–1.50)
GFR: 94.34 mL/min (ref >60.00)
Glucose, Bld: 92 mg/dL (ref 70–99)
Potassium: 4.2 mEq/L (ref 3.5–5.1)
Sodium: 138 mEq/L (ref 135–145)
Total Bilirubin: 0.5 mg/dL (ref 0.2–1.2)
Total Protein: 6.9 g/dL (ref 6.0–8.3)

## 2019-12-13 LAB — TSH: TSH: 2.04 u[IU]/mL (ref 0.35–4.50)

## 2019-12-13 LAB — LIPID PANEL
Cholesterol: 192 mg/dL (ref 0–200)
HDL: 39.2 mg/dL (ref >39.00)
NonHDL: 153.09
Total CHOL/HDL Ratio: 5
Triglycerides: 293 mg/dL — ABNORMAL HIGH (ref 0.0–149.0)
VLDL: 58.6 mg/dL — ABNORMAL HIGH (ref 0.0–40.0)

## 2019-12-13 LAB — LDL CHOLESTEROL, DIRECT: Direct LDL: 108 mg/dL

## 2019-12-13 NOTE — Progress Notes (Signed)
Office Note 12/13/2019  CC:  Chief Complaint  Patient presents with  . Annual Exam    pt is fasting    HPI:  Geoffrey Contreras is a 39 y.o. White male who is here for annual health maintenance exam. No exercise lately. Diet: OK, not as much fast food.   Has medically fragile child, busy with this.  Also helping take care of father in law and mother in law, both of whom have parkinson's dz.  No acute complaints.  Past Medical History:  Diagnosis Date  . Allergic rhinitis   . Cervical radiculopathy at C7 2014   transient numbness R arm when working hard Raliegh Ip did w/u and offered spinal steroid injection but pt chose to decline this).  . Hypertriglyceridemia 05/2015; 12/2016; 01/2018   Mild, with mildly low HDL (no meds; TLC recommended)  . Leukopenia 2016   Transient (saw Dr. Marin Olp and was released/reassured)  . Obesity, Class I, BMI 30-34.9     Past Surgical History:  Procedure Laterality Date  . NO PAST SURGERIES      Family History  Problem Relation Age of Onset  . Breast cancer Mother 33  . Diabetes Mother        ??  . Cancer Maternal Grandmother        Lung  . Colon cancer Neg Hx   . Prostate cancer Neg Hx   . CAD Neg Hx     Social History   Socioeconomic History  . Marital status: Married    Spouse name: Geoffrey Contreras  . Number of children: 1  . Years of education: 12th  . Highest education level: Not on file  Occupational History  . Occupation: flooring; Therapist, occupational  Tobacco Use  . Smoking status: Former Smoker    Packs/day: 1.00    Years: 11.00    Pack years: 11.00    Types: Cigarettes    Start date: 11/11/1995    Quit date: 09/06/2007    Years since quitting: 12.2  . Smokeless tobacco: Never Used  . Tobacco comment: QUIT 7 YEARS AGO  Substance and Sexual Activity  . Alcohol use: Yes    Alcohol/week: 2.0 - 3.0 standard drinks    Types: 2 - 3 Standard drinks or equivalent per week    Comment: only on weeknds  . Drug use: No  . Sexual  activity: Not on file  Other Topics Concern  . Not on file  Social History Narrative   Lives with his wife and their son.   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   Orig from [place], [place].   No tob, occ alcohol.  No hx of alc or drug abuse.   Social Determinants of Health   Financial Resource Strain:   . Difficulty of Paying Living Expenses:   Food Insecurity:   . Worried About Charity fundraiser in the Last Year:   . Arboriculturist in the Last Year:   Transportation Needs:   . Film/video editor (Medical):   Marland Kitchen Lack of Transportation (Non-Medical):   Physical Activity:   . Days of Exercise per Week:   . Minutes of Exercise per Session:   Stress:   . Feeling of Stress :   Social Connections:   . Frequency of Communication with Friends and Family:   . Frequency of Social Gatherings with Friends and Family:   . Attends Religious Services:   . Active Member of Clubs or Organizations:   .  Attends Banker Meetings:   Marland Kitchen Marital Status:   Intimate Partner Violence:   . Fear of Current or Ex-Partner:   . Emotionally Abused:   Marland Kitchen Physically Abused:   . Sexually Abused:     Outpatient Medications Prior to Visit  Medication Sig Dispense Refill  . fluticasone (CUTIVATE) 0.05 % cream Apply topically 2 (two) times daily. (Patient not taking: Reported on 12/13/2019) 60 g 1   No facility-administered medications prior to visit.    Allergies  Allergen Reactions  . Penicillins Rash    ROS Review of Systems  Constitutional: Negative for appetite change, chills, fatigue and fever.  HENT: Negative for congestion, dental problem, ear pain and sore throat.   Eyes: Negative for discharge, redness and visual disturbance.  Respiratory: Negative for cough, chest tightness, shortness of breath and wheezing.   Cardiovascular: Negative for chest pain, palpitations and leg swelling.  Gastrointestinal: Negative for abdominal pain, blood in stool, diarrhea,  nausea and vomiting.  Genitourinary: Negative for difficulty urinating, dysuria, flank pain, frequency, hematuria and urgency.  Musculoskeletal: Negative for arthralgias, back pain, joint swelling, myalgias and neck stiffness.  Skin: Negative for pallor and rash.  Neurological: Negative for dizziness, speech difficulty, weakness and headaches.  Hematological: Negative for adenopathy. Does not bruise/bleed easily.  Psychiatric/Behavioral: Negative for confusion and sleep disturbance. The patient is not nervous/anxious.     PE; Blood pressure 139/83, pulse (!) 55, temperature 98 F (36.7 C), temperature source Temporal, resp. rate 16, height 5\' 7"  (1.702 m), weight 217 lb 9.6 oz (98.7 kg), SpO2 96 %. Body mass index is 34.08 kg/m.  Gen: Alert, well appearing.  Patient is oriented to person, place, time, and situation. AFFECT: pleasant, lucid thought and speech. ENT: Ears: EACs clear, normal epithelium.  TMs with good light reflex and landmarks bilaterally.  Eyes: no injection, icteris, swelling, or exudate.  EOMI, PERRLA. Nose: no drainage or turbinate edema/swelling.  No injection or focal lesion.  Mouth: lips without lesion/swelling.  Oral mucosa pink and moist.  Dentition intact and without obvious caries or gingival swelling.  Oropharynx without erythema, exudate, or swelling.  Neck: supple/nontender.  No LAD, mass, or TM.  Carotid pulses 2+ bilaterally, without bruits. CV: RRR, no m/r/g.   LUNGS: CTA bilat, nonlabored resps, good aeration in all lung fields. ABD: soft, NT, ND, BS normal.  No hepatospenomegaly or mass.  No bruits. EXT: no clubbing, cyanosis, or edema.  Musculoskeletal: no joint swelling, erythema, warmth, or tenderness.  ROM of all joints intact. Skin - no sores or suspicious lesions or rashes or color changes   Pertinent labs:  Lab Results  Component Value Date   TSH 1.52 01/05/2018   Lab Results  Component Value Date   WBC 5.8 01/05/2018   HGB 14.6 01/05/2018    HCT 43.5 01/05/2018   MCV 88.6 01/05/2018   PLT 185.0 01/05/2018   Lab Results  Component Value Date   CREATININE 0.95 01/05/2018   BUN 12 01/05/2018   NA 141 01/05/2018   K 4.1 01/05/2018   CL 106 01/05/2018   CO2 28 01/05/2018   Lab Results  Component Value Date   ALT 12 01/05/2018   AST 13 01/05/2018   ALKPHOS 44 01/05/2018   BILITOT 0.6 01/05/2018   Lab Results  Component Value Date   CHOL 166 01/05/2018   Lab Results  Component Value Date   HDL 39.20 01/05/2018   Lab Results  Component Value Date   LDLCALC 88 01/05/2018  Lab Results  Component Value Date   TRIG 197.0 (H) 01/05/2018   Lab Results  Component Value Date   CHOLHDL 4 01/05/2018    ASSESSMENT AND PLAN:   Health maintenance exam: Reviewed age and gender appropriate health maintenance issues (prudent diet, regular exercise, health risks of tobacco and excessive alcohol, use of seatbelts, fire alarms in home, use of sunscreen).  Also reviewed age and gender appropriate health screening as well as vaccine recommendations. Vaccines: Tdap UTD. Labs: fasting HP labs ordered. Gave some encouragement and emotional support today.  He's going through a lot with family health care issues. He is looking to get more active and lose a few pounds.  An After Visit Summary was printed and given to the patient.  FOLLOW UP:  Return in about 1 year (around 12/12/2020) for annual CPE (fasting).  Signed:  Santiago Bumpers, MD           12/13/2019

## 2019-12-13 NOTE — Patient Instructions (Signed)

## 2020-06-14 DIAGNOSIS — H52203 Unspecified astigmatism, bilateral: Secondary | ICD-10-CM | POA: Diagnosis not present

## 2020-09-08 DIAGNOSIS — Z91018 Allergy to other foods: Secondary | ICD-10-CM

## 2020-09-08 HISTORY — DX: Allergy to other foods: Z91.018

## 2020-10-19 ENCOUNTER — Other Ambulatory Visit: Payer: Self-pay

## 2020-10-22 ENCOUNTER — Ambulatory Visit: Payer: BC Managed Care – PPO | Admitting: Family Medicine

## 2020-10-22 ENCOUNTER — Other Ambulatory Visit: Payer: Self-pay

## 2020-10-22 ENCOUNTER — Encounter: Payer: Self-pay | Admitting: Family Medicine

## 2020-10-22 VITALS — BP 131/90 | HR 57 | Temp 97.7°F | Resp 16 | Ht 67.0 in | Wt 209.6 lb

## 2020-10-22 DIAGNOSIS — L509 Urticaria, unspecified: Secondary | ICD-10-CM | POA: Diagnosis not present

## 2020-10-22 MED ORDER — EPINEPHRINE 0.3 MG/0.3ML IJ SOAJ: 0.3000 mg | INTRAMUSCULAR | 1 refills | Status: DC | PRN

## 2020-10-22 NOTE — Patient Instructions (Signed)
If you have another episode of hives, take benadryl just like you did before.  Then when your rash is gone you should start taking an over the counter "nonsedating" antihistamine every day to PREVENT hives (generic for allegra, zyrtec, or claritin are all fine choices).

## 2020-10-22 NOTE — Progress Notes (Signed)
OFFICE VISIT  10/22/2020  CC:  Chief Complaint  Patient presents with  . Allergic Reaction    Hives on 2/10. Unsure the cause of reaction but used benadryl.    HPI:    Patient is a 40 y.o. Caucasian male who presents for "allergic reaction".  About 4 d/a started getting some nausea, itching all over head, looked in mirror and noted he was covered in hives.  No obvious swelling of tongue, throat, lips, or eyes.  No wheezing or SOB. Benadryl, cold shower, hives gradually faded over the next 8 hours or so.  Has not had to repeat benadryl since then, no other meds taken. Pictures reviewed today: classic hives all over face, neck, trunk, legs. No known new foods or contact irritants/allergens. Treated lumber has made him have hives in the past but he had not been around it recently this time. No recent viral illness sxs'.  Has dry skin, some eczema.   Allergic rhinitis sx's around moist/moldy environments.  Past Medical History:  Diagnosis Date  . Allergic rhinitis   . Cervical radiculopathy at C7 2014   transient numbness R arm when working hard Delbert Harness did w/u and offered spinal steroid injection but pt chose to decline this).  . Hypertriglyceridemia 05/2015; 12/2016; 01/2018   Mild, with mildly low HDL (no meds; TLC recommended)  . Leukopenia 2016   Transient (saw Dr. Myna Hidalgo and was released/reassured)  . Obesity, Class I, BMI 30-34.9     Past Surgical History:  Procedure Laterality Date  . NO PAST SURGERIES      No outpatient medications prior to visit.   No facility-administered medications prior to visit.    Allergies  Allergen Reactions  . Penicillins Rash    ROS As per HPI  PE: Vitals with BMI 10/22/2020 12/13/2019 04/13/2018  Height 5\' 7"  5\' 7"  5\' 7"   Weight 209 lbs 10 oz 217 lbs 10 oz 214 lbs 8 oz  BMI 32.82 34.07 33.59  Systolic 131 139  Diastolic 90 83 74  Pulse 57 55 52     Gen: Alert, well appearing.  Patient is oriented to person, place,  time, and situation. AFFECT: pleasant, lucid thought and speech. CV: RRR, no m/r/g.   LUNGS: CTA bilat, nonlabored resps, good aeration in all lung fields. EXT: no clubbing or cyanosis.  no edema.  SKIN: no rash.  LABS:  Lab Results  Component Value Date   TSH 2.04 12/13/2019   Lab Results  Component Value Date   WBC 7.0 12/13/2019   HGB 15.0 12/13/2019   HCT 44.9 12/13/2019   MCV 88.7 12/13/2019   PLT 182.0 12/13/2019   Lab Results  Component Value Date   CREATININE 0.90 12/13/2019   BUN 12 12/13/2019   NA 138 12/13/2019   K 4.2 12/13/2019   CL 104 12/13/2019   CO2 26 12/13/2019   Lab Results  Component Value Date   ALT 16 12/13/2019   AST 14 12/13/2019   ALKPHOS 49 12/13/2019   BILITOT 0.5 12/13/2019   Lab Results  Component Value Date   CHOL 192 12/13/2019   Lab Results  Component Value Date   HDL 39.20 12/13/2019   Lab Results  Component Value Date   LDLCALC 88 01/05/2018   Lab Results  Component Value Date   TRIG 293.0 (H) 12/13/2019   Lab Results  Component Value Date   CHOLHDL 5 12/13/2019   IMPRESSION AND PLAN:  Urticaria.  Episode resolved with benadryl.  No  known trigger. No signs of angioedema or anaphylaxis. Refer to allergist for consideration of allergy testing. OK to stay off daily antihistamine treatment at this time but if recurrence w/out known trigger then would start daily otc nonsedating antihistamine. I rx'd epi-pen today and discussed appropriate prn use with pt.  An After Visit Summary was printed and given to the patient.  FOLLOW UP: Return for as needed.  Signed:  Santiago Bumpers, MD           10/22/2020

## 2020-11-05 NOTE — Progress Notes (Signed)
New Patient Note  RE: Geoffrey Contreras MRN: [id number] DOB: Jan 08, 1981 Date of Office Visit: 11/06/2020  Referring provider: Tammi Sou, MD Primary care provider: Tammi Sou, MD  Chief Complaint: Urticaria  History of Present Illness: I had the pleasure of seeing Geoffrey Contreras for initial evaluation at the [facility] on 11/06/2020. He is a 40 y.o. male, who is referred here by McGowen, Adrian Blackwater, MD for the evaluation of urticaria.  On 10/18/20 patient came home around 7 - 7:30PM and noted some abdominal pains then he had some itchy head, hives on the torso and arms.  He had smoked BBQ 5 hours ago which is unusual for him. His coworker also brought in his dog to work but patient has a dog at home. Questionable exposure to mildew that day.  Patient does eat red meat at least once a week and has not noticed any symptoms. Denies eating more that day but it was his first meal of the day. He has been avoiding red meat since then with no additional reactions. No recent tick bites.   Dairy causes some increased abdominal gas. He also had Mac & cheese with his BBQ.   He eats a varied diet with no issues.   He took benadryl and a cool shower and within 45 minutes the hives started improving and by the next day symptoms resolved completely. No additional episodes.   About 1 year ago had similar symptoms as above after working with treated wood on a deck. He is not sure if he had eaten red meat that day. Patient concerned if he could be allergic to wood perhaps.   Suspected triggers are unknown. Denies any fevers, chills, changes in medications, foods, personal care products or recent infections. He has tried the following therapies: benadryl prn with good benefit.  Previous work up includes: none. Patient is up to date with the following cancer screening tests: physical exam, prostate exam.  Reviewed images on the phone - consistent with urticarial lesions.    10/22/2020 PCP visit: "About 4 d/a started getting some nausea, itching all over head, looked in mirror and noted he was covered in hives.  No obvious swelling of tongue, throat, lips, or eyes.  No wheezing or SOB. Benadryl, cold shower, hives gradually faded over the next 8 hours or so.  Has not had to repeat benadryl since then, no other meds taken. Pictures reviewed today: classic hives all over face, neck, trunk, legs. No known new foods or contact irritants/allergens. Treated lumber has made him have hives in the past but he had not been around it recently this time. No recent viral illness sxs'.  Has dry skin, some eczema.   Allergic rhinitis sx's around moist/moldy environments."  Assessment and Plan: Geoffrey Contreras is a 40 y.o. male with: Allergic reaction Recent episode of allergic reaction with whole body hives, pruritic head and abdominal pain. Had smoked BBQ 5 hours prior which is new for him but he was eating red meat once a week with no issues. Symptoms resolved with benadryl within 1 day. Similar symptoms about 1 year ago after working outdoors with treated wood. Concerned about allergies. Denies changes in medications, personal care products or recent infections.   Today's skin testing showed: Positive to mold and cockroach.  Discussed with patient that based on clinical history, I doubt the above allergens caused the symptoms. I'm more concerned about potential alpha-gal allergy.   Continue to avoid all red meat  for now - no pork, beef, lamb.  Monitor symptoms after dairy intake as he sometimes has some abdominal issues afterwards.   For mild symptoms you can take over the counter antihistamines such as Benadryl and monitor symptoms closely. If symptoms worsen or if you have severe symptoms including breathing issues, throat closure, significant swelling, whole body hives, severe diarrhea and vomiting, lightheadedness then inject epinephrine and seek immediate medical care  afterwards.  Food action plan given.  Get bloodwork for alpha gal.   Other allergic rhinitis Mild rhinoconjunctivitis symptoms in the spring.  No prior allergy testing.  Today's skin prick testing showed: Positive to mold and cockroach. Declines intradermal testing today.   Start environmental control measures as below.  May use over the counter antihistamines such as Zyrtec (cetirizine), Claritin (loratadine), Allegra (fexofenadine), or Xyzal (levocetirizine) daily as needed.  Return in about 6 months (around 05/09/2021).  Lab Orders     Alpha-Gal Panel     Tryptase  Other allergy screening: Asthma: no Rhino conjunctivitis: mild rhino conjunctivitis during the spring Food allergy: no   Medication allergy: yes  Penicillin causes rash/hives as a child.  Hymenoptera allergy: no - large localized reactions Eczema:yes History of recurrent infections suggestive of immunodeficency: no  Diagnostics: Skin Testing: Environmental allergy panel. Positive to mold and cockroach. Results discussed with patient/family.  Airborne Adult Perc - 11/06/20 0944    Time Antigen Placed 0944    Allergen Manufacturer Lavella Hammock    Location Back    Number of Test 59    Panel 1 Select    1. Control-Buffer 50% Glycerol Negative    2. Control-Histamine 1 mg/ml 2+    3. Albumin saline Negative    4. Westfield Negative    5. Guatemala Negative    6. Johnson Negative    7. Lumberton Blue Negative    8. Meadow Fescue Negative    9. Perennial Rye Negative    10. Sweet Vernal Negative    11. Timothy Negative    12. Cocklebur Negative    13. Burweed Marshelder Negative    14. Ragweed, short Negative    15. Ragweed, Giant Negative    16. Plantain,  English Negative    17. Lamb's Quarters Negative    18. Sheep Sorrell Negative    19. Rough Pigweed Negative    20. Marsh Elder, Rough Negative    21. Mugwort, Common Negative    22. Ash mix Negative    23. Birch mix Negative    24. Beech American Negative     25. Box, Elder Negative    26. Cedar, red Negative    27. Cottonwood, [place] Negative    28. Elm mix Negative    29. Hickory Negative    30. Maple mix Negative    31. Oak, [place] mix Negative    32. Pecan Pollen Negative    33. Pine mix Negative    34. Sycamore Eastern Negative    35. Gladstone, Black Pollen Negative    36. Alternaria alternata Negative    37. Cladosporium Herbarum Negative    38. Aspergillus mix Negative    39. Penicillium mix Negative    40. Bipolaris sorokiniana (Helminthosporium) Negative    41. Drechslera spicifera (Curvularia) Negative    42. Mucor plumbeus Negative    43. Fusarium moniliforme Negative    44. Aureobasidium pullulans (pullulara) Negative    45. Rhizopus oryzae Negative    46. Botrytis cinera Negative    47. Epicoccum nigrum Negative  48. Phoma betae 2+    49. Candida Albicans --   +/-   50. Trichophyton mentagrophytes Negative    51. Mite, D Farinae  5,000 AU/ml Negative    52. Mite, D Pteronyssinus  5,000 AU/ml Negative    53. Cat Hair 10,000 BAU/ml Negative    54.  Dog Epithelia Negative    55. Mixed Feathers Negative    56. Horse Epithelia Negative    57. Cockroach, German 2+    58. Mouse Negative    59. Tobacco Leaf Negative           Past Medical History: Patient Active Problem List   Diagnosis Date Noted  . Allergic reaction 11/06/2020  . Obesity (BMI 30-39.9) 12/13/2019  . Health maintenance examination 05/29/2015  . Annual physical exam 02/28/2014  . Skin lesion and mole 02/28/2014  . Other allergic rhinitis    Past Medical History:  Diagnosis Date  . Allergic rhinitis   . Cervical radiculopathy at C7 2014   transient numbness R arm when working hard Raliegh Ip did w/u and offered spinal steroid injection but pt chose to decline this).  . Eczema   . Hypertriglyceridemia 05/2015; 12/2016; 01/2018   Mild, with mildly low HDL (no meds; TLC recommended)  . Leukopenia 2016   Transient (saw Dr. Marin Olp and was  released/reassured)  . Obesity, Class I, BMI 30-34.9    Past Surgical History: Past Surgical History:  Procedure Laterality Date  . NO PAST SURGERIES     Medication List:  Current Outpatient Medications  Medication Sig Dispense Refill  . EPINEPHrine 0.3 mg/0.3 mL IJ SOAJ injection Inject 0.3 mg into the muscle as needed for anaphylaxis. 2 each 1   No current facility-administered medications for this visit.   Allergies: Allergies  Allergen Reactions  . Penicillins Rash   Social History: Social History   Socioeconomic History  . Marital status: Married    Spouse name: [name]  . Number of children: 1  . Years of education: 12th  . Highest education level: Not on file  Occupational History  . Occupation: flooring; Therapist, occupational  Tobacco Use  . Smoking status: Former Smoker    Packs/day: 1.00    Years: 11.00    Pack years: 11.00    Types: Cigarettes    Start date: 11/11/1995    Quit date: 09/06/2007    Years since quitting: 13.1  . Smokeless tobacco: Never Used  . Tobacco comment: QUIT 7 YEARS AGO  Vaping Use  . Vaping Use: Never used  Substance and Sexual Activity  . Alcohol use: Yes    Alcohol/week: 2.0 - 3.0 standard drinks    Types: 2 - 3 Standard drinks or equivalent per week    Comment: only on weeknds  . Drug use: No  . Sexual activity: Not on file  Other Topics Concern  . Not on file  Social History Narrative   Lives with his wife and their son.   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   Orig from [place], [place].   No tob, occ alcohol.  No hx of alc or drug abuse.   Social Determinants of Health   Financial Resource Strain: Not on file  Food Insecurity: Not on file  Transportation Needs: Not on file  Physical Activity: Not on file  Stress: Not on file  Social Connections: Not on file   Lives in a 39 year old house. Smoking: denies Occupation: Magazine features editor HistoryFreight forwarder in  the house:  no Carpet in the family room: no Carpet in the bedroom: yes Heating: heat pump Cooling: central Pet: yes 1 cat x 10 yrs and 1 dog x 3 yrs  Family History: Family History  Problem Relation Age of Onset  . Breast cancer Mother 66  . Diabetes Mother        ??  . Eczema Mother   . Allergic rhinitis Mother   . Eczema Brother   . Cancer Maternal Grandmother        Lung  . Colon cancer Neg Hx   . Prostate cancer Neg Hx   . CAD Neg Hx    Review of Systems  Constitutional: Negative for appetite change, chills, fever and unexpected weight change.  HENT: Negative for congestion and rhinorrhea.   Eyes: Negative for itching.  Respiratory: Negative for cough, chest tightness, shortness of breath and wheezing.   Cardiovascular: Negative for chest pain.  Gastrointestinal: Negative for abdominal pain.  Genitourinary: Negative for difficulty urinating.  Skin: Negative for rash.  Allergic/Immunologic: Positive for environmental allergies.  Neurological: Negative for headaches.   Objective: BP 136/82   Pulse 73   Temp 97.7 F (36.5 C) (Temporal)   Resp 17   Ht 5' 8.5" (1.74 m)   Wt 216 lb 4 oz (98.1 kg)   SpO2 97%   BMI 32.40 kg/m  Body mass index is 32.4 kg/m. Physical Exam Vitals and nursing note reviewed.  Constitutional:      Appearance: Normal appearance. He is well-developed.  HENT:     Head: Normocephalic and atraumatic.     Right Ear: External ear normal.     Left Ear: External ear normal.     Nose: Nose normal.     Mouth/Throat:     Mouth: Mucous membranes are moist.     Pharynx: Oropharynx is clear.  Eyes:     Conjunctiva/sclera: Conjunctivae normal.  Cardiovascular:     Rate and Rhythm: Normal rate and regular rhythm.     Heart sounds: Normal heart sounds. No murmur heard. No friction rub. No gallop.   Pulmonary:     Effort: Pulmonary effort is normal.     Breath sounds: Normal breath sounds. No wheezing, rhonchi or rales.  Abdominal:     Palpations:  Abdomen is soft.  Musculoskeletal:     Cervical back: Neck supple.  Skin:    General: Skin is warm.     Findings: No rash.  Neurological:     Mental Status: He is alert and oriented to person, place, and time.  Psychiatric:        Behavior: Behavior normal.    The plan was reviewed with the patient/family, and all questions/concerned were addressed.  It was my pleasure to see Geoffrey Contreras today and participate in his care. Please feel free to contact me with any questions or concerns.  Sincerely,  Rexene Alberts, DO Allergy & Immunology  [facility] office: [place] office: [phone]

## 2020-11-06 ENCOUNTER — Encounter: Payer: Self-pay | Admitting: Allergy

## 2020-11-06 ENCOUNTER — Other Ambulatory Visit: Payer: Self-pay

## 2020-11-06 ENCOUNTER — Ambulatory Visit: Payer: BC Managed Care – PPO | Admitting: Allergy

## 2020-11-06 VITALS — BP 136/82 | HR 73 | Temp 97.7°F | Resp 17 | Ht 68.5 in | Wt 216.2 lb

## 2020-11-06 DIAGNOSIS — J3089 Other allergic rhinitis: Secondary | ICD-10-CM

## 2020-11-06 DIAGNOSIS — T7840XA Allergy, unspecified, initial encounter: Secondary | ICD-10-CM | POA: Insufficient documentation

## 2020-11-06 DIAGNOSIS — T7840XD Allergy, unspecified, subsequent encounter: Secondary | ICD-10-CM | POA: Diagnosis not present

## 2020-11-06 NOTE — Assessment & Plan Note (Signed)
Recent episode of allergic reaction with whole body hives, pruritic head and abdominal pain. Had smoked BBQ 5 hours prior which is new for him but he was eating red meat once a week with no issues. Symptoms resolved with benadryl within 1 day. Similar symptoms about 1 year ago after working outdoors with treated wood. Concerned about allergies. Denies changes in medications, personal care products or recent infections.   Today's skin testing showed: Positive to mold and cockroach.  Discussed with patient that based on clinical history, I doubt the above allergens caused the symptoms. I'm more concerned about potential alpha-gal allergy.   Continue to avoid all red meat for now - no pork, beef, lamb.  Monitor symptoms after dairy intake as he sometimes has some abdominal issues afterwards.   For mild symptoms you can take over the counter antihistamines such as Benadryl and monitor symptoms closely. If symptoms worsen or if you have severe symptoms including breathing issues, throat closure, significant swelling, whole body hives, severe diarrhea and vomiting, lightheadedness then inject epinephrine and seek immediate medical care afterwards.  Food action plan given.  Get bloodwork for alpha gal.

## 2020-11-06 NOTE — Patient Instructions (Addendum)
Today's skin testing showed: Positive to mold and cockroach.  Allergic reaction:  I don't think the above allergens caused the reaction.  I'm more concerned about the BBQ you had earlier that day.  Continue to avoid all red meat for now - no pork, beef, lamb.  Monitor symptoms after dairy intake.   For mild symptoms you can take over the counter antihistamines such as Benadryl and monitor symptoms closely. If symptoms worsen or if you have severe symptoms including breathing issues, throat closure, significant swelling, whole body hives, severe diarrhea and vomiting, lightheadedness then inject epinephrine and seek immediate medical care afterwards.  Food action plan given.  Environmental allergies  Start environmental control measures as below.  May use over the counter antihistamines such as Zyrtec (cetirizine), Claritin (loratadine), Allegra (fexofenadine), or Xyzal (levocetirizine) daily as needed.  . Get bloodwork:  o We are ordering labs, so please allow 1-2 weeks for the results to come back. o With the newly implemented Cures Act, the labs might be visible to you at the same time that they become visible to me. However, I will not address the results until all of the results are back, so please be patient.  o In the meantime, continue recommendations in your patient instructions, including avoidance measures (if applicable), until you hear from me.  Follow up in 6 months or sooner if needed.    Skin care recommendations  Bath time: . Always use lukewarm water. AVOID very hot or cold water. Marland Kitchen Keep bathing time to 5-10 minutes. . Do NOT use bubble bath. . Use a mild soap and use just enough to wash the dirty areas. . Do NOT scrub skin vigorously.  . After bathing, pat dry your skin with a towel. Do NOT rub or scrub the skin.  Moisturizers and prescriptions:  . ALWAYS apply moisturizers immediately after bathing (within 3 minutes). This helps to lock-in moisture. . Use  the moisturizer several times a day over the whole body. Peri Jefferson summer moisturizers include: Aveeno, CeraVe, Cetaphil. Peri Jefferson winter moisturizers include: Aquaphor, Vaseline, Cerave, Cetaphil, Eucerin, Vanicream. . When using moisturizers along with medications, the moisturizer should be applied about one hour after applying the medication to prevent diluting effect of the medication or moisturize around where you applied the medications. When not using medications, the moisturizer can be continued twice daily as maintenance.  Laundry and clothing: . Avoid laundry products with added color or perfumes. . Use unscented hypo-allergenic laundry products such as Tide free, Cheer free & gentle, and All free and clear.  . If the skin still seems dry or sensitive, you can try double-rinsing the clothes. . Avoid tight or scratchy clothing such as wool. . Do not use fabric softeners or dyer sheets.

## 2020-11-06 NOTE — Assessment & Plan Note (Signed)
Mild rhinoconjunctivitis symptoms in the spring.  No prior allergy testing.  Today's skin prick testing showed: Positive to mold and cockroach. Declines intradermal testing today.   Start environmental control measures as below.  May use over the counter antihistamines such as Zyrtec (cetirizine), Claritin (loratadine), Allegra (fexofenadine), or Xyzal (levocetirizine) daily as needed.

## 2020-11-09 LAB — ALPHA-GAL PANEL
Allergen Lamb IgE: 0.45 kU/L — AB
Beef IgE: 1.82 kU/L — AB
IgE (Immunoglobulin E), Serum: 108 IU/mL (ref 6–495)
O215-IgE Alpha-Gal: 4.34 kU/L — AB
Pork IgE: 0.86 kU/L — AB

## 2020-11-09 LAB — TRYPTASE: Tryptase: 8.2 ug/L (ref 2.2–13.2)

## 2021-04-29 ENCOUNTER — Telehealth: Payer: Self-pay

## 2021-04-29 ENCOUNTER — Encounter: Payer: BC Managed Care – PPO | Admitting: Family Medicine

## 2021-04-29 ENCOUNTER — Encounter: Payer: Self-pay | Admitting: Family Medicine

## 2021-04-29 NOTE — Telephone Encounter (Signed)
[facility] Day - Client Nonclinical Telephone Record  [facility] Client [facility] Day - Client Client Site [facility] - Day Physician Santiago Bumpers - MD Contact Type Call Who Is Calling Patient / Member / Family / Caregiver Caller Name Moishy Laday Caller Phone Number [phone] Patient Name Geoffrey Contreras Patient DOB March 17, 1981 Call Type Message Only Information Provided Reason for Call Request to Schedule Office Appointment Initial Comment Caller says that her husband has an appt. on Monday but had to go out of town and needs to reschedule. Disp. Time Disposition Final User 04/27/2021 5:12:38 PM General Information Provided Yes Einar Pheasant Call Closed By: Einar Pheasant Transaction Date/Time: 04/27/2021 5:10:24 PM (ET)

## 2021-04-29 NOTE — Progress Notes (Deleted)
Office Note 04/29/2021  CC: No chief complaint on file.   HPI:  Geoffrey Contreras is a 40 y.o. White male who is here for annual health maintenance exam. He has hx of hypertriglyceridemia and class I obesity.  Past Medical History:  Diagnosis Date   Allergic rhinitis    Cervical radiculopathy at C7 2014   transient numbness R arm when working hard Geoffrey Contreras did w/u and offered spinal steroid injection but pt chose to decline this).   Eczema    Hypertriglyceridemia 05/2015; 12/2016; 01/2018   Mild, with mildly low HDL (no meds; TLC recommended)   Leukopenia 2016   Transient (saw Dr. Myna Contreras and was released/reassured)   Obesity, Class I, BMI 30-34.9     Past Surgical History:  Procedure Laterality Date   NO PAST SURGERIES      Family History  Problem Relation Age of Onset   Breast cancer Mother 51   Diabetes Mother        ??   Eczema Mother    Allergic rhinitis Mother    Eczema Brother    Cancer Maternal Grandmother        Lung   Colon cancer Neg Hx    Prostate cancer Neg Hx    CAD Neg Hx     Social History   Socioeconomic History   Marital status: Married    Spouse name: Geoffrey Contreras   Number of children: 1   Years of education: 12th   Highest education level: Not on file  Occupational History   Occupation: Chief of Staff; Radiographer, therapeutic  Tobacco Use   Smoking status: Former    Packs/day: 1.00    Years: 11.00    Pack years: 11.00    Types: Cigarettes    Start date: 11/11/1995    Quit date: 09/06/2007    Years since quitting: 13.6   Smokeless tobacco: Never   Tobacco comments:    QUIT 7 YEARS AGO  Vaping Use   Vaping Use: Never used  Substance and Sexual Activity   Alcohol use: Yes    Alcohol/week: 2.0 - 3.0 standard drinks    Types: 2 - 3 Standard drinks or equivalent per week    Comment: only on weeknds   Drug use: No   Sexual activity: Not on file  Other Topics Concern   Not on file  Social History Narrative   Lives with his wife and their son.    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   Orig from [place], [place].   No tob, occ alcohol.  No hx of alc or drug abuse.   Social Determinants of Health   Financial Resource Strain: Not on file  Food Insecurity: Not on file  Transportation Needs: Not on file  Physical Activity: Not on file  Stress: Not on file  Social Connections: Not on file  Intimate Partner Violence: Not on file    Outpatient Medications Prior to Visit  Medication Sig Dispense Refill   EPINEPHrine 0.3 mg/0.3 mL IJ SOAJ injection Inject 0.3 mg into the muscle as needed for anaphylaxis. 2 each 1   No facility-administered medications prior to visit.    Allergies  Allergen Reactions   Penicillins Rash    ROS *** PE; Vitals with BMI 11/06/2020 10/22/2020 12/13/2019  Height 5' 8.504" 5\' 7"  5\' 7"   Weight 216 lbs 4 oz 209 lbs 10 oz 217 lbs 10 oz  BMI 32.4 32.82 34.07  Systolic 136 131  Diastolic 82 90 83  Pulse 73 57 55     *** Pertinent labs:  Lab Results  Component Value Date   TSH 2.04 12/13/2019   Lab Results  Component Value Date   WBC 7.0 12/13/2019   HGB 15.0 12/13/2019   HCT 44.9 12/13/2019   MCV 88.7 12/13/2019   PLT 182.0 12/13/2019   Lab Results  Component Value Date   CREATININE 0.90 12/13/2019   BUN 12 12/13/2019   NA 138 12/13/2019   K 4.2 12/13/2019   CL 104 12/13/2019   CO2 26 12/13/2019   Lab Results  Component Value Date   ALT 16 12/13/2019   AST 14 12/13/2019   ALKPHOS 49 12/13/2019   BILITOT 0.5 12/13/2019   Lab Results  Component Value Date   CHOL 192 12/13/2019   Lab Results  Component Value Date   HDL 39.20 12/13/2019   Lab Results  Component Value Date   LDLCALC 88 01/05/2018   Lab Results  Component Value Date   TRIG 293.0 (H) 12/13/2019   Lab Results  Component Value Date   CHOLHDL 5 12/13/2019   ASSESSMENT AND PLAN:   Health maintenance exam: Reviewed age and gender appropriate health maintenance issues (prudent diet,  regular exercise, health risks of tobacco and excessive alcohol, use of seatbelts, fire alarms in home, use of sunscreen).  Also reviewed age and gender appropriate health screening as well as vaccine recommendations. Vaccines: ALL UTD. Labs: fasting HP labs ordered. Prostate ca screening: average risk patient= as per latest guidelines, start screening 50 yrs of age. Colon ca screening: average risk patient= as per latest guidelines, start screening at 32 yrs of age.   An After Visit Summary was printed and given to the patient.  FOLLOW UP:  No follow-ups on file.  Signed:  Santiago Bumpers, MD           04/29/2021

## 2021-08-08 ENCOUNTER — Encounter: Payer: Self-pay | Admitting: Allergy

## 2021-10-17 DIAGNOSIS — D2239 Melanocytic nevi of other parts of face: Secondary | ICD-10-CM | POA: Diagnosis not present

## 2021-10-17 DIAGNOSIS — L2089 Other atopic dermatitis: Secondary | ICD-10-CM | POA: Diagnosis not present

## 2021-11-11 ENCOUNTER — Ambulatory Visit (INDEPENDENT_AMBULATORY_CARE_PROVIDER_SITE_OTHER): Payer: BC Managed Care – PPO | Admitting: Family Medicine

## 2021-11-11 ENCOUNTER — Other Ambulatory Visit: Payer: Self-pay

## 2021-11-11 ENCOUNTER — Encounter: Payer: Self-pay | Admitting: Family Medicine

## 2021-11-11 VITALS — BP 121/82 | HR 60 | Temp 97.3°F | Ht 68.5 in | Wt 209.4 lb

## 2021-11-11 DIAGNOSIS — Z91018 Allergy to other foods: Secondary | ICD-10-CM

## 2021-11-11 DIAGNOSIS — Z0000 Encounter for general adult medical examination without abnormal findings: Secondary | ICD-10-CM

## 2021-11-11 DIAGNOSIS — E611 Iron deficiency: Secondary | ICD-10-CM

## 2021-11-11 DIAGNOSIS — E538 Deficiency of other specified B group vitamins: Secondary | ICD-10-CM

## 2021-11-11 HISTORY — DX: Iron deficiency: E61.1

## 2021-11-11 HISTORY — DX: Deficiency of other specified B group vitamins: E53.8

## 2021-11-11 LAB — COMPREHENSIVE METABOLIC PANEL
ALT: 15 U/L (ref 0–53)
AST: 15 U/L (ref 0–37)
Albumin: 4.2 g/dL (ref 3.5–5.2)
Alkaline Phosphatase: 45 U/L (ref 39–117)
BUN: 14 mg/dL (ref 6–23)
CO2: 24 mEq/L (ref 19–32)
Calcium: 9 mg/dL (ref 8.4–10.5)
Chloride: 103 mEq/L (ref 96–112)
Creatinine, Ser: 0.89 mg/dL (ref 0.40–1.50)
GFR: 107.5 mL/min (ref >60.00)
Glucose, Bld: 89 mg/dL (ref 70–99)
Potassium: 4 mEq/L (ref 3.5–5.1)
Sodium: 137 mEq/L (ref 135–145)
Total Bilirubin: 0.6 mg/dL (ref 0.2–1.2)
Total Protein: 6.7 g/dL (ref 6.0–8.3)

## 2021-11-11 LAB — CBC WITH DIFFERENTIAL/PLATELET
Basophils Absolute: 0 10*3/uL (ref 0.0–0.1)
Basophils Relative: 0.5 % (ref 0.0–3.0)
Eosinophils Absolute: 0.1 10*3/uL (ref 0.0–0.7)
Eosinophils Relative: 2 % (ref 0.0–5.0)
HCT: 41.5 % (ref 39.0–52.0)
Hemoglobin: 13.9 g/dL (ref 13.0–17.0)
Lymphocytes Relative: 38 % (ref 12.0–46.0)
Lymphs Abs: 2.3 10*3/uL (ref 0.7–4.0)
MCHC: 33.6 g/dL (ref 30.0–36.0)
MCV: 90.3 fl (ref 78.0–100.0)
Monocytes Absolute: 0.4 10*3/uL (ref 0.1–1.0)
Monocytes Relative: 7.1 % (ref 3.0–12.0)
Neutro Abs: 3.1 10*3/uL (ref 1.4–7.7)
Neutrophils Relative %: 52.4 % (ref 43.0–77.0)
Platelets: 186 10*3/uL (ref 150.0–400.0)
RBC: 4.6 Mil/uL (ref 4.22–5.81)
RDW: 13.4 % (ref 11.5–15.5)
WBC: 5.9 10*3/uL (ref 4.0–10.5)

## 2021-11-11 LAB — LIPID PANEL
Cholesterol: 179 mg/dL (ref 0–200)
HDL: 43.6 mg/dL (ref >39.00)
LDL Cholesterol: 105 mg/dL — ABNORMAL HIGH (ref 0–99)
NonHDL: 135.07
Total CHOL/HDL Ratio: 4
Triglycerides: 150 mg/dL — ABNORMAL HIGH (ref 0.0–149.0)
VLDL: 30 mg/dL (ref 0.0–40.0)

## 2021-11-11 LAB — TSH: TSH: 1.11 u[IU]/mL (ref 0.35–5.50)

## 2021-11-11 LAB — VITAMIN B12: Vitamin B-12: 120 pg/mL — ABNORMAL LOW (ref 211–911)

## 2021-11-11 NOTE — Progress Notes (Addendum)
Office Note ?11/11/2021 ? ?CC:  ?Chief Complaint  ?Patient presents with  ? Annual Exam  ?  Pt is fasting  ? ?HPI:  ?Patient is a 41 y.o. male who is here for annual health maintenance exam. ? ?Evaluation by allergist for urticaria in September 2022 revealed alpha gal positivity.  He was advised to avoid all mammalian meat. ? ?Tarry feels good. ?He has avoided all mammalian meat for about a year now.  Fortunately, this has led to resolution of his recurrent abdominal pain.  He has had no rashes. ? ?Past Medical History:  ?Diagnosis Date  ? Allergic rhinitis   ? Allergy to alpha-gal 2022  ? Cervical radiculopathy at C7 2014  ? transient numbness R arm when working hard Delbert Harness did w/u and offered spinal steroid injection but pt chose to decline this).  ? Eczema   ? Hypertriglyceridemia 05/2015; 12/2016; 01/2018  ? Mild, with mildly low HDL (no meds; TLC recommended)  ? Leukopenia 2016  ? Transient (saw Dr. Myna Hidalgo and was released/reassured)  ? Obesity, Class I, BMI 30-34.9   ? ? ?Past Surgical History:  ?Procedure Laterality Date  ? NO PAST SURGERIES    ? ? ?Family History  ?Problem Relation Age of Onset  ? Breast cancer Mother 56  ? Diabetes Mother   ?     ??  ? Eczema Mother   ? Allergic rhinitis Mother   ? Eczema Brother   ? Cancer Maternal Grandmother   ?     Lung  ? Colon cancer Neg Hx   ? Prostate cancer Neg Hx   ? CAD Neg Hx   ? ? ?Social History  ? ?Socioeconomic History  ? Marital status: Married  ?  Spouse name: Florentina Addison  ? Number of children: 1  ? Years of education: 12th  ? Highest education level: Not on file  ?Occupational History  ? Occupation: Surveyor, minerals  ?Tobacco Use  ? Smoking status: Former  ?  Packs/day: 1.00  ?  Years: 11.00  ?  Pack years: 11.00  ?  Types: Cigarettes  ?  Start date: 11/11/1995  ?  Quit date: 09/06/2007  ?  Years since quitting: 14.1  ? Smokeless tobacco: Never  ? Tobacco comments:  ?  QUIT 7 YEARS AGO  ?Vaping Use  ? Vaping Use: Never used  ?Substance and Sexual  Activity  ? Alcohol use: Yes  ?  Alcohol/week: 2.0 - 3.0 standard drinks  ?  Types: 2 - 3 Standard drinks or equivalent per week  ?  Comment: only on weeknds  ? Drug use: No  ? Sexual activity: Not on file  ?Other Topics Concern  ? Not on file  ?Social History Narrative  ? Lives with his wife and their son.  ? 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  ? Orig from [place], [place].  ? No tob, occ alcohol.  No hx of alc or drug abuse.  ? ?Social Determinants of Health  ? ?Financial Resource Strain: Not on file  ?Food Insecurity: Not on file  ?Transportation Needs: Not on file  ?Physical Activity: Not on file  ?Stress: Not on file  ?Social Connections: Not on file  ?Intimate Partner Violence: Not on file  ? ? ?Outpatient Medications Prior to Visit  ?Medication Sig Dispense Refill  ? EPINEPHrine 0.3 mg/0.3 mL IJ SOAJ injection Inject 0.3 mg into the muscle as needed for anaphylaxis. (Patient not taking: Reported on 11/11/2021) 2 each 1  ? ?  No facility-administered medications prior to visit.  ? ? ?Allergies  ?Allergen Reactions  ? Penicillins Rash  ? ? ?ROS ?Review of Systems  ?Constitutional:  Negative for appetite change, chills, fatigue and fever.  ?HENT:  Negative for congestion, dental problem, ear pain and sore throat.   ?Eyes:  Negative for discharge, redness and visual disturbance.  ?Respiratory:  Negative for cough, chest tightness, shortness of breath and wheezing.   ?Cardiovascular:  Negative for chest pain, palpitations and leg swelling.  ?Gastrointestinal:  Negative for abdominal pain, blood in stool, diarrhea, nausea and vomiting.  ?Genitourinary:  Negative for difficulty urinating, dysuria, flank pain, frequency, hematuria and urgency.  ?Musculoskeletal:  Negative for arthralgias, back pain, joint swelling, myalgias and neck stiffness.  ?Skin:  Negative for pallor and rash.  ?Neurological:  Negative for dizziness, speech difficulty, weakness and headaches.  ?Hematological:  Negative for  adenopathy. Does not bruise/bleed easily.  ?Psychiatric/Behavioral:  Negative for confusion and sleep disturbance. The patient is not nervous/anxious.   ? ?PE; ?Vitals with BMI 11/11/2021 11/06/2020 10/22/2020  ?Height 5' 8.5" 5' 8.504" 5\' 7"   ?Weight 209 lbs 6 oz 216 lbs 4 oz 209 lbs 10 oz  ?BMI 31.37 32.4 32.82  ?Systolic 121 136 103  ?Diastolic 82 82 90  ?Pulse 60 73 57  ? ?Gen: Alert, well appearing.  Patient is oriented to person, place, time, and situation. ?AFFECT: pleasant, lucid thought and speech. ?ENT: Ears: EACs clear, normal epithelium.  TMs with good light reflex and landmarks bilaterally.  Eyes: no injection, icteris, swelling, or exudate.  EOMI, PERRLA. ?Nose: no drainage or turbinate edema/swelling.  No injection or focal lesion.  Mouth: lips without lesion/swelling.  Oral mucosa pink and moist.  Dentition intact and without obvious caries or gingival swelling.  Oropharynx without erythema, exudate, or swelling.  ?Neck: supple/nontender.  No LAD, mass, or TM.  Carotid pulses 2+ bilaterally, without bruits. ?CV: RRR, no m/r/g.   ?LUNGS: CTA bilat, nonlabored resps, good aeration in all lung fields. ?ABD: soft, NT, ND, BS normal.  No hepatospenomegaly or mass.  No bruits. ?EXT: no clubbing, cyanosis, or edema.  ?Musculoskeletal: no joint swelling, erythema, warmth, or tenderness.  ROM of all joints intact. ?Skin - no sores or suspicious lesions or rashes or color changes ? ? ?Pertinent labs:  ?Lab Results  ?Component Value Date  ? TSH 2.04 12/13/2019  ? ?Lab Results  ?Component Value Date  ? WBC 7.0 12/13/2019  ? HGB 15.0 12/13/2019  ? HCT 44.9 12/13/2019  ? MCV 88.7 12/13/2019  ? PLT 182.0 12/13/2019  ? ?Lab Results  ?Component Value Date  ? CREATININE 0.90 12/13/2019  ? BUN 12 12/13/2019  ? NA 138 12/13/2019  ? K 4.2 12/13/2019  ? CL 104 12/13/2019  ? CO2 26 12/13/2019  ? ?Lab Results  ?Component Value Date  ? ALT 16 12/13/2019  ? AST 14 12/13/2019  ? ALKPHOS 49 12/13/2019  ? BILITOT 0.5 12/13/2019   ? ?Lab Results  ?Component Value Date  ? CHOL 192 12/13/2019  ? ?Lab Results  ?Component Value Date  ? HDL 39.20 12/13/2019  ? ?Lab Results  ?Component Value Date  ? LDLCALC 88 01/05/2018  ? ?Lab Results  ?Component Value Date  ? TRIG 293.0 (H) 12/13/2019  ? ?Lab Results  ?Component Value Date  ? CHOLHDL 5 12/13/2019  ? ?ASSESSMENT AND PLAN:  ? ?No problem-specific Assessment & Plan notes found for this encounter. ? ?Health maintenance exam: ?Reviewed age and gender appropriate health maintenance  issues (prudent diet, regular exercise, health risks of tobacco and excessive alcohol, use of seatbelts, fire alarms in home, use of sunscreen).  Also reviewed age and gender appropriate health screening as well as vaccine recommendations. ?Vaccines: Tdap UTD. ?Labs: fasting HP.  Additionally, due to his elimination of all meat from his diet for the last 1 year (alpha gal allergy) I will check iron and vitamin B12 levels.  He has plans to follow-up with his allergist. ?Prostate ca screening: average risk patient= as per latest guidelines, start screening at 32 yrs of age. ?Colon ca screening: average risk patient= as per latest guidelines, start screening at 15 yrs of age. ? ? ?An After Visit Summary was printed and given to the patient. ? ?FOLLOW UP:  Return in about 1 year (around 11/12/2022) for annual CPE (fasting). ? ?Signed:  Santiago Bumpers, MD           11/11/2021 ? ?

## 2021-11-11 NOTE — Patient Instructions (Signed)

## 2021-11-12 ENCOUNTER — Encounter: Payer: Self-pay | Admitting: Family Medicine

## 2021-11-12 LAB — IRON,TIBC AND FERRITIN PANEL
%SAT: 19 % (calc) — ABNORMAL LOW (ref 20–48)
Ferritin: 81 ng/mL (ref 38–380)
Iron: 61 ug/dL (ref 50–180)
TIBC: 319 mcg/dL (calc) (ref 250–425)

## 2021-11-14 ENCOUNTER — Other Ambulatory Visit (INDEPENDENT_AMBULATORY_CARE_PROVIDER_SITE_OTHER): Payer: BC Managed Care – PPO

## 2021-11-14 DIAGNOSIS — E559 Vitamin D deficiency, unspecified: Secondary | ICD-10-CM

## 2021-11-14 LAB — VITAMIN D 25 HYDROXY (VIT D DEFICIENCY, FRACTURES): VITD: 12.99 ng/mL — ABNORMAL LOW (ref 30.00–100.00)

## 2021-11-14 NOTE — Telephone Encounter (Signed)
If vitamin D can be added onto his labs please do so, diagnosis vitamin D deficiency. ?If unable to add on then please order future vitamin D level, same diagnosis, he can get this when he returns for repeat of his vitamin B12.  Thank you. ?

## 2022-01-14 ENCOUNTER — Ambulatory Visit: Payer: BC Managed Care – PPO

## 2022-04-16 ENCOUNTER — Ambulatory Visit: Payer: BC Managed Care – PPO | Admitting: Family Medicine

## 2022-04-16 ENCOUNTER — Encounter: Payer: Self-pay | Admitting: Family Medicine

## 2022-04-16 VITALS — BP 113/75 | HR 47 | Temp 97.5°F | Ht 68.5 in | Wt 205.0 lb

## 2022-04-16 DIAGNOSIS — B353 Tinea pedis: Secondary | ICD-10-CM | POA: Diagnosis not present

## 2022-04-16 MED ORDER — TERBINAFINE HCL 250 MG PO TABS: ORAL_TABLET | ORAL | 0 refills | Status: DC

## 2022-04-16 NOTE — Progress Notes (Signed)
OFFICE VISIT  04/16/2022  CC:  Chief Complaint  Patient presents with   Feet burning    A couple of months (2 months), pt was wearing thick socks and walked in wet grass. Bought spray (tanactin) and used for a few weeks but within 3-4 days after stopping, feet started itching again.    Patient is a 41 y.o. male who presents for feet concerns.  HPI: Recurrent pinkish discoloration with fine peeling of skin on the soles of both feet.  It itches significantly. Typically uses over-the-counter antifungal spray and this helps resolve over the course of a couple of weeks.  Most recently though it is lasted about 6 weeks, although it does look improved compared to when it started per patient report. No rash on any other areas of his body. He has bought new shoes and changes his socks in the middle of the day and this has not helped significantly.  Past Medical History:  Diagnosis Date   Allergic rhinitis    Allergy to alpha-gal 2022   Cervical radiculopathy at C7 2014   transient numbness R arm when working hard Delbert Harness did w/u and offered spinal steroid injection but pt chose to decline this).   Dietary iron deficiency 11/11/2021   After no meat for 1 year->his only abnormality on iron panel was slightly low transferrin sat of 19% (normal is >20%)->started iron qod   Eczema    Hypertriglyceridemia 05/2015; 12/2016; 01/2018   Mild, with mildly low HDL (no meds; TLC recommended)   Leukopenia 2016   Transient (saw Dr. Myna Hidalgo and was released/reassured)   Obesity, Class I, BMI 30-34.9    Vitamin B12 deficiency 11/11/2021   after no meat for 1 yr-->started sublingual b12 qd    Past Surgical History:  Procedure Laterality Date   NO PAST SURGERIES      Outpatient Medications Prior to Visit  Medication Sig Dispense Refill   EPINEPHrine 0.3 mg/0.3 mL IJ SOAJ injection Inject 0.3 mg into the muscle as needed for anaphylaxis. (Patient not taking: Reported on 11/11/2021) 2 each 1   No  facility-administered medications prior to visit.    Allergies  Allergen Reactions   Penicillins Rash    ROS As per HPI  PE:    04/16/2022    8:42 AM 11/11/2021   10:03 AM 11/06/2020    9:18 AM  Vitals with BMI  Height 5' 8.5" 5' 8.5" 5' 8.504"  Weight 205 lbs 209 lbs 6 oz 216 lbs 4 oz  BMI 30.71 31.37 32.4  Systolic 113 121 379  Diastolic 75 82 82  Pulse 47 60 73   Physical Exam  Gen: Alert, well appearing.  Patient is oriented to person, place, time, and situation. AFFECT: pleasant, lucid thought and speech. Soles of both feet with fine pinkish splotches of macular rash with some fine desquamation present.  Moccasin distribution  LABS:  Last CBC Lab Results  Component Value Date   WBC 5.9 11/11/2021   HGB 13.9 11/11/2021   HCT 41.5 11/11/2021   MCV 90.3 11/11/2021   MCH 29.7 10/09/2014   RDW 13.4 11/11/2021   PLT 186.0 11/11/2021   Last metabolic panel Lab Results  Component Value Date   GLUCOSE 89 11/11/2021   NA 137 11/11/2021   K 4.0 11/11/2021   CL 103 11/11/2021   CO2 24 11/11/2021   BUN 14 11/11/2021   CREATININE 0.89 11/11/2021   CALCIUM 9.0 11/11/2021   PROT 6.7 11/11/2021   ALBUMIN 4.2 11/11/2021  BILITOT 0.6 11/11/2021   ALKPHOS 45 11/11/2021   AST 15 11/11/2021   ALT 15 11/11/2021   IMPRESSION AND PLAN:  Tinea pedis, recurrent. Most recent episode seems a bit more resistant to treatment. Hot weather, excess sweating likely contributing. Will do 1 week course of terbinafine 250 mg a day. Continue over-the-counter antifungal spray on feet as well as in footwear.  Change socks midway through the day as he is currently doing.  An After Visit Summary was printed and given to the patient.  FOLLOW UP: No follow-ups on file.  Signed:  Santiago Bumpers, MD           04/16/2022

## 2022-12-02 ENCOUNTER — Encounter: Payer: Self-pay | Admitting: Family Medicine

## 2022-12-03 ENCOUNTER — Other Ambulatory Visit: Payer: Self-pay

## 2022-12-03 MED ORDER — EPINEPHRINE 0.3 MG/0.3ML IJ SOAJ: 0.3000 mg | INTRAMUSCULAR | 1 refills | Status: DC | PRN

## 2023-02-13 DIAGNOSIS — H52203 Unspecified astigmatism, bilateral: Secondary | ICD-10-CM | POA: Diagnosis not present

## 2023-04-14 ENCOUNTER — Ambulatory Visit (INDEPENDENT_AMBULATORY_CARE_PROVIDER_SITE_OTHER): Payer: BC Managed Care – PPO | Admitting: Family Medicine

## 2023-04-14 ENCOUNTER — Encounter: Payer: Self-pay | Admitting: Family Medicine

## 2023-04-14 VITALS — BP 131/86 | HR 53 | Ht 68.0 in | Wt 202.0 lb

## 2023-04-14 DIAGNOSIS — E611 Iron deficiency: Secondary | ICD-10-CM

## 2023-04-14 DIAGNOSIS — E538 Deficiency of other specified B group vitamins: Secondary | ICD-10-CM

## 2023-04-14 DIAGNOSIS — Z0000 Encounter for general adult medical examination without abnormal findings: Secondary | ICD-10-CM

## 2023-04-14 NOTE — Progress Notes (Signed)
Office Note 04/14/2023  CC:  Chief Complaint  Patient presents with   Annual Exam    Pt is fasting   HPI:  Patient is a 42 y.o. male who is here for annual health maintenance exam.  Last visit in March 2023 showed low vitamin B12 and low normal iron.  I started him on supplements that day.  He no longer takes the supplements. He continues to avoid mammalian meat because of his alpha gal allergy.  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  Past Medical History:  Diagnosis Date   Allergic rhinitis    Allergy to alpha-gal 2022   Cervical radiculopathy at C7 2014   transient numbness R arm when working hard Geoffrey Contreras did w/u and offered spinal steroid injection but pt chose to decline this).   Dietary iron deficiency 11/11/2021   After no meat for 1 year->his only abnormality on iron panel was slightly low transferrin sat of 19% (normal is >20%)->started iron qod   Eczema    Hypertriglyceridemia 05/2015; 12/2016; 01/2018   Mild, with mildly low HDL (no meds; TLC recommended)   Leukopenia 2016   Transient (saw Dr. Myna Hidalgo and was released/reassured)   Obesity, Class I, BMI 30-34.9    Vitamin B12 deficiency 11/11/2021   after no meat for 1 yr-->started sublingual b12 qd    Past Surgical History:  Procedure Laterality Date   NO PAST SURGERIES      Family History  Problem Relation Age of Onset   Breast cancer Mother 68   Diabetes Mother        ??   Eczema Mother    Allergic rhinitis Mother    Eczema Brother    Cancer Maternal Grandmother        Lung   Colon cancer Neg Hx    Prostate cancer Neg Hx    CAD Neg Hx     Social History   Socioeconomic History   Marital status: Married    Spouse name: [name]   Number of children: 1   Years of education: 12th   Highest education level: Not on file  Occupational History   Occupation: Chief of Staff; Radiographer, therapeutic  Tobacco Use   Smoking status: Former    Current packs/day: 0.00     Average packs/day: 1 pack/day for 11.8 years (11.8 ttl pk-yrs)    Types: Cigarettes    Start date: 11/11/1995    Quit date: 09/06/2007    Years since quitting: 15.6   Smokeless tobacco: Never   Tobacco comments:    QUIT 7 YEARS AGO  Vaping Use   Vaping status: Never Used  Substance and Sexual Activity   Alcohol use: Yes    Alcohol/week: 2.0 - 3.0 standard drinks of alcohol    Types: 2 - 3 Standard drinks or equivalent per week    Comment: only on weeknds   Drug use: No   Sexual activity: Not on file  Other Topics Concern   Not on file  Social History Narrative   Lives with his wife and their son.   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   Orig from [place], [place].   No tob, occ alcohol.  No hx of alc or drug abuse.   Social Determinants of Health   Financial Resource Strain: Not on file  Food Insecurity: Not on file  Transportation Needs: Not on file  Physical Activity: Not on file  Stress: Not on file  Social Connections: Not on file  Intimate Partner Violence: Not on file    Outpatient Medications Prior to Visit  Medication Sig Dispense Refill   EPINEPHrine 0.3 mg/0.3 mL IJ SOAJ injection Inject 0.3 mg into the muscle as needed for anaphylaxis. (Patient not taking: Reported on 04/14/2023) 2 each 1   terbinafine (LAMISIL) 250 MG tablet 1 tab p.o. daily 7 tablet 0   No facility-administered medications prior to visit.    Allergies  Allergen Reactions   Penicillins Rash    Review of Systems  Constitutional:  Negative for appetite change, chills, fatigue and fever.  HENT:  Negative for congestion, dental problem, ear pain and sore throat.   Eyes:  Negative for discharge, redness and visual disturbance.  Respiratory:  Negative for cough, chest tightness, shortness of breath and wheezing.   Cardiovascular:  Negative for chest pain, palpitations and leg swelling.  Gastrointestinal:  Negative for abdominal pain, blood in stool, diarrhea, nausea and  vomiting.  Genitourinary:  Negative for difficulty urinating, dysuria, flank pain, frequency, hematuria and urgency.  Musculoskeletal:  Negative for arthralgias, back pain, joint swelling, myalgias and neck stiffness.  Skin:  Negative for pallor and rash.  Neurological:  Negative for dizziness, speech difficulty, weakness and headaches.  Hematological:  Negative for adenopathy. Does not bruise/bleed easily.  Psychiatric/Behavioral:  Negative for confusion and sleep disturbance. The patient is not nervous/anxious.     PE;    04/14/2023   10:53 AM 04/16/2022    8:42 AM 11/11/2021   10:03 AM  Vitals with BMI  Height 5\' 8"  5' 8.5" 5' 8.5"  Weight 202 lbs 205 lbs 209 lbs 6 oz  BMI 30.72 30.71 31.37  Systolic 131 113 914  Diastolic 86 75 82  Pulse 53 47 60   Gen: Alert, well appearing.  Patient is oriented to person, place, time, and situation. AFFECT: pleasant, lucid thought and speech. ENT: Ears: EACs clear, normal epithelium.  TMs with good light reflex and landmarks bilaterally.  Eyes: no injection, icteris, swelling, or exudate.  EOMI, PERRLA. Nose: no drainage or turbinate edema/swelling.  No injection or focal lesion.  Mouth: lips without lesion/swelling.  Oral mucosa pink and moist.  Dentition intact and without obvious caries or gingival swelling.  Oropharynx without erythema, exudate, or swelling.  Neck: supple/nontender.  No LAD, mass, or TM.  Carotid pulses 2+ bilaterally, without bruits. CV: RRR, no m/r/g.   LUNGS: CTA bilat, nonlabored resps, good aeration in all lung fields. ABD: soft, NT, ND, BS normal.  No hepatospenomegaly or mass.  No bruits. EXT: no clubbing, cyanosis, or edema.  Musculoskeletal: no joint swelling, erythema, warmth, or tenderness.  ROM of all joints intact. Skin - no sores or suspicious lesions or rashes or color changes  Pertinent labs:  Lab Results  Component Value Date   TSH 1.11 11/11/2021   Lab Results  Component Value Date   WBC 5.9 11/11/2021    HGB 13.9 11/11/2021   HCT 41.5 11/11/2021   MCV 90.3 11/11/2021   PLT 186.0 11/11/2021   Lab Results  Component Value Date   CREATININE 0.89 11/11/2021   BUN 14 11/11/2021   NA 137 11/11/2021   K 4.0 11/11/2021   CL 103 11/11/2021   CO2 24 11/11/2021   Lab Results  Component Value Date   ALT 15 11/11/2021   AST 15 11/11/2021   ALKPHOS 45 11/11/2021   BILITOT 0.6 11/11/2021   Lab Results  Component Value Date   CHOL 179 11/11/2021  Lab Results  Component Value Date   HDL 43.60 11/11/2021   Lab Results  Component Value Date   LDLCALC 105 (H) 11/11/2021   Lab Results  Component Value Date   TRIG 150.0 (H) 11/11/2021   Lab Results  Component Value Date   CHOLHDL 4 11/11/2021   Lab Results  Component Value Date   VITAMINB12 120 (L) 11/11/2021   Lab Results  Component Value Date   IRON 61 11/11/2021   TIBC 319 11/11/2021   FERRITIN 81 11/11/2021   ASSESSMENT AND PLAN:   Health maintenance exam: Reviewed age and gender appropriate health maintenance issues (prudent diet, regular exercise, health risks of tobacco and excessive alcohol, use of seatbelts, fire alarms in home, use of sunscreen).  Also reviewed age and gender appropriate health screening as well as vaccine recommendations. Vaccines: Tdap UTD. Labs: fasting HP.  Additionally, due to his elimination of all meat from his diet for the last 2 years (alpha gal allergy) I will check iron and vitamin B12 levels.  He has plans to follow-up with his allergist. Prostate ca screening: average risk patient= as per latest guidelines, start screening at 87 yrs of age. Colon ca screening: average risk patient= as per latest guidelines, start screening at 47 yrs of age.  Alpha gal allergy--> avoids mammalian meat. As a result he has had a history of vitamin B12 deficiency and low normal iron levels. He was on supplements at one point but nothing lately.  We will recheck levels today.  An After Visit Summary was  printed and given to the patient.  FOLLOW UP:  Return in about 1 year (around 04/13/2024) for annual CPE (fasting).  Signed:  Santiago Bumpers, MD           04/14/2023

## 2023-04-15 ENCOUNTER — Encounter: Payer: Self-pay | Admitting: Family Medicine

## 2023-04-15 ENCOUNTER — Ambulatory Visit: Payer: BC Managed Care – PPO

## 2023-04-15 LAB — IBC + FERRITIN
Ferritin: 69.4 ng/mL (ref 22.0–322.0)
Iron: 106 ug/dL (ref 42–165)
Saturation Ratios: 26.1 % (ref 20.0–50.0)
TIBC: 406 ug/dL (ref 250.0–450.0)
Transferrin: 290 mg/dL (ref 212.0–360.0)

## 2023-04-15 NOTE — Addendum Note (Signed)
Addended by: Emi Holes D on: 04/15/2023 04:13 PM   Modules accepted: Orders

## 2023-04-16 ENCOUNTER — Ambulatory Visit (INDEPENDENT_AMBULATORY_CARE_PROVIDER_SITE_OTHER): Payer: BC Managed Care – PPO

## 2023-04-16 ENCOUNTER — Other Ambulatory Visit: Payer: Self-pay

## 2023-04-16 ENCOUNTER — Encounter: Payer: Self-pay | Admitting: Family Medicine

## 2023-04-16 DIAGNOSIS — E559 Vitamin D deficiency, unspecified: Secondary | ICD-10-CM

## 2023-04-16 LAB — VITAMIN D 25 HYDROXY (VIT D DEFICIENCY, FRACTURES): VITD: 24.24 ng/mL — ABNORMAL LOW (ref 30.00–100.00)

## 2023-04-16 NOTE — Telephone Encounter (Signed)
Britt: can 25-hydroxy vitamin D be added to this?  Diagnosis vitamin D deficiency.

## 2023-04-16 NOTE — Telephone Encounter (Signed)
Lab add on request faxed

## 2024-05-11 ENCOUNTER — Encounter: Payer: Self-pay | Admitting: Family Medicine

## 2024-05-11 ENCOUNTER — Ambulatory Visit (INDEPENDENT_AMBULATORY_CARE_PROVIDER_SITE_OTHER): Admitting: Family Medicine

## 2024-05-11 VITALS — BP 120/70 | HR 46 | Temp 97.6°F | Ht 69.0 in | Wt 209.8 lb

## 2024-05-11 DIAGNOSIS — E559 Vitamin D deficiency, unspecified: Secondary | ICD-10-CM

## 2024-05-11 DIAGNOSIS — Z13220 Encounter for screening for lipoid disorders: Secondary | ICD-10-CM | POA: Diagnosis not present

## 2024-05-11 DIAGNOSIS — Z0000 Encounter for general adult medical examination without abnormal findings: Secondary | ICD-10-CM | POA: Diagnosis not present

## 2024-05-11 DIAGNOSIS — E538 Deficiency of other specified B group vitamins: Secondary | ICD-10-CM

## 2024-05-11 DIAGNOSIS — Z91018 Allergy to other foods: Secondary | ICD-10-CM | POA: Diagnosis not present

## 2024-05-11 LAB — CBC WITH DIFFERENTIAL/PLATELET
Basophils Absolute: 0 K/uL (ref 0.0–0.1)
Basophils Relative: 0.6 % (ref 0.0–3.0)
Eosinophils Absolute: 0.1 K/uL (ref 0.0–0.7)
Eosinophils Relative: 1.3 % (ref 0.0–5.0)
HCT: 43.1 % (ref 39.0–52.0)
Hemoglobin: 14.3 g/dL (ref 13.0–17.0)
Lymphocytes Relative: 41.8 % (ref 12.0–46.0)
Lymphs Abs: 2.4 K/uL (ref 0.7–4.0)
MCHC: 33.3 g/dL (ref 30.0–36.0)
MCV: 90.5 fl (ref 78.0–100.0)
Monocytes Absolute: 0.4 K/uL (ref 0.1–1.0)
Monocytes Relative: 6.8 % (ref 3.0–12.0)
Neutro Abs: 2.8 K/uL (ref 1.4–7.7)
Neutrophils Relative %: 49.5 % (ref 43.0–77.0)
Platelets: 181 K/uL (ref 150.0–400.0)
RBC: 4.76 Mil/uL (ref 4.22–5.81)
RDW: 13.3 % (ref 11.5–15.5)
WBC: 5.7 K/uL (ref 4.0–10.5)

## 2024-05-11 LAB — COMPREHENSIVE METABOLIC PANEL WITH GFR
ALT: 12 U/L (ref 0–53)
AST: 14 U/L (ref 0–37)
Albumin: 4.1 g/dL (ref 3.5–5.2)
Alkaline Phosphatase: 46 U/L (ref 39–117)
BUN: 12 mg/dL (ref 6–23)
CO2: 26 meq/L (ref 19–32)
Calcium: 8.3 mg/dL — ABNORMAL LOW (ref 8.4–10.5)
Chloride: 104 meq/L (ref 96–112)
Creatinine, Ser: 0.85 mg/dL (ref 0.40–1.50)
GFR: 107.11 mL/min (ref >60.00)
Glucose, Bld: 86 mg/dL (ref 70–99)
Potassium: 3.9 meq/L (ref 3.5–5.1)
Sodium: 139 meq/L (ref 135–145)
Total Bilirubin: 0.5 mg/dL (ref 0.2–1.2)
Total Protein: 6.6 g/dL (ref 6.0–8.3)

## 2024-05-11 LAB — LIPID PANEL
Cholesterol: 162 mg/dL (ref 0–200)
HDL: 42.6 mg/dL (ref >39.00)
LDL Cholesterol: 78 mg/dL (ref 0–99)
NonHDL: 119.59
Total CHOL/HDL Ratio: 4
Triglycerides: 208 mg/dL — ABNORMAL HIGH (ref 0.0–149.0)
VLDL: 41.6 mg/dL — ABNORMAL HIGH (ref 0.0–40.0)

## 2024-05-11 LAB — IBC + FERRITIN
Ferritin: 51 ng/mL (ref 22.0–322.0)
Iron: 66 ug/dL (ref 42–165)
Saturation Ratios: 19.7 % — ABNORMAL LOW (ref 20.0–50.0)
TIBC: 334.6 ug/dL (ref 250.0–450.0)
Transferrin: 239 mg/dL (ref 212.0–360.0)

## 2024-05-11 LAB — VITAMIN B12: Vitamin B-12: 111 pg/mL — ABNORMAL LOW (ref 211–911)

## 2024-05-11 LAB — TSH: TSH: 0.86 u[IU]/mL (ref 0.35–5.50)

## 2024-05-11 LAB — VITAMIN D 25 HYDROXY (VIT D DEFICIENCY, FRACTURES): VITD: 20.81 ng/mL — ABNORMAL LOW (ref 30.00–100.00)

## 2024-05-11 NOTE — Progress Notes (Signed)
 Office Note 05/11/2024  CC:  Chief Complaint  Patient presents with   Annual Exam    Pt is fasting   Patient is a 43 y.o. male who is here for annual health maintenance exam and follow-up B12 deficiency. A/P as of last visit: Health maintenance exam: Reviewed age and gender appropriate health maintenance issues (prudent diet, regular exercise, health risks of tobacco and excessive alcohol, use of seatbelts, fire alarms in home, use of sunscreen).  Also reviewed age and gender appropriate health screening as well as vaccine recommendations. Vaccines: Tdap UTD. Labs: fasting HP.  Additionally, due to his elimination of all meat from his diet for the last 2 years (alpha gal allergy ) I will check iron and vitamin B12 levels.  He has plans to follow-up with his allergist. Prostate ca screening: average risk patient= as per latest guidelines, start screening at 50 yrs of age. Colon ca screening: average risk patient= as per latest guidelines, start screening at 43 yrs of age.   Alpha gal allergy --> avoids mammalian meat. As a result he has had a history of vitamin B12 deficiency and low normal iron levels. He was on supplements at one point but nothing lately.  We will recheck levels today.  INTERIM HX: Geoffrey Contreras feels well. He admits his diet is not very good, although he does still avoid mammalian meat.  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  He thinks he took some vitamin supplements after our last visit but does not recall how long.  He has not been on any recently.  Past Medical History:  Diagnosis Date   Allergic rhinitis    Allergy  to alpha-gal 2022   Cervical radiculopathy at C7 2014   transient numbness R arm when working hard Alphonse Millman did w/u and offered spinal steroid injection but pt chose to decline this).   Dietary iron deficiency 11/11/2021   After no meat for 1 year->his only abnormality on iron panel was slightly low transferrin sat  of 19% (normal is >20%)->started iron qod.  Normal iron panel 04/2023 (off of supp)   Eczema    Hypertriglyceridemia 05/2015; 12/2016; 01/2018   Mild, with mildly low HDL (no meds; TLC recommended)   Leukopenia 2016   Transient (saw Dr. Timmy and was released/reassured)   Obesity, Class I, BMI 30-34.9    Vitamin B12 deficiency 11/11/2021   after no meat for 1 yr-->started sublingual b12 qd   Vitamin D  deficiency     Past Surgical History:  Procedure Laterality Date   NO PAST SURGERIES      Family History  Problem Relation Age of Onset   Breast cancer Mother 63   Diabetes Mother        ??   Eczema Mother    Allergic rhinitis Mother    Eczema Brother    Cancer Maternal Grandmother        Lung   Colon cancer Neg Hx    Prostate cancer Neg Hx    CAD Neg Hx     Social History   Socioeconomic History   Marital status: Married    Spouse name: [name]   Number of children: 1   Years of education: 12th   Highest education level: Not on file  Occupational History   Occupation: Chief of Staff; Radiographer, therapeutic  Tobacco Use   Smoking status: Former    Current packs/day: 0.00    Average packs/day: 1 pack/day for 11.8 years (11.8 ttl pk-yrs)    Types: Cigarettes  Start date: 11/11/1995    Quit date: 09/06/2007    Years since quitting: 16.6   Smokeless tobacco: Never   Tobacco comments:    QUIT 7 YEARS AGO  Vaping Use   Vaping status: Never Used  Substance and Sexual Activity   Alcohol use: Yes    Alcohol/week: 2.0 - 3.0 standard drinks of alcohol    Types: 2 - 3 Standard drinks or equivalent per week    Comment: only on weeknds   Drug use: No   Sexual activity: Not on file  Other Topics Concern   Not on file  Social History Narrative   Lives with his wife and their son.   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   Orig from [place], [place].   No tob, occ alcohol.  No hx of alc or drug abuse.   Social Drivers of Corporate investment banker Strain: Not on  file  Food Insecurity: Not on file  Transportation Needs: Not on file  Physical Activity: Not on file  Stress: Not on file  Social Connections: Not on file  Intimate Partner Violence: Not on file    Outpatient Medications Prior to Visit  Medication Sig Dispense Refill   EPINEPHrine  0.3 mg/0.3 mL IJ SOAJ injection Inject 0.3 mg into the muscle as needed for anaphylaxis. (Patient not taking: Reported on 05/11/2024) 2 each 1   No facility-administered medications prior to visit.    Allergies  Allergen Reactions   Penicillins Rash   Alpha-Gal     Review of Systems  Constitutional:  Negative for appetite change, chills, fatigue and fever.  HENT:  Negative for congestion, dental problem, ear pain and sore throat.   Eyes:  Negative for discharge, redness and visual disturbance.  Respiratory:  Negative for cough, chest tightness, shortness of breath and wheezing.   Cardiovascular:  Negative for chest pain, palpitations and leg swelling.  Gastrointestinal:  Negative for abdominal pain, blood in stool, diarrhea, nausea and vomiting.  Genitourinary:  Negative for difficulty urinating, dysuria, flank pain, frequency, hematuria and urgency.  Musculoskeletal:  Negative for arthralgias, back pain, joint swelling, myalgias and neck stiffness.  Skin:  Negative for pallor and rash.  Neurological:  Negative for dizziness, speech difficulty, weakness and headaches.  Hematological:  Negative for adenopathy. Does not bruise/bleed easily.  Psychiatric/Behavioral:  Negative for confusion and sleep disturbance. The patient is not nervous/anxious.     PE;    05/11/2024   10:35 AM 04/14/2023   10:53 AM 04/16/2022    8:42 AM  Vitals with BMI  Height 5' 9 5' 8 5' 8.5  Weight 209 lbs 13 oz 202 lbs 205 lbs  BMI 30.97 30.72 30.71  Systolic 120 131 886  Diastolic 70 86 75  Pulse 46 53 47   Gen: Alert, well appearing.  Patient is oriented to person, place, time, and situation. AFFECT: pleasant, lucid  thought and speech. ENT: Ears: EACs clear, normal epithelium.  TMs with good light reflex and landmarks bilaterally.  Eyes: no injection, icteris, swelling, or exudate.  EOMI, PERRLA. Nose: no drainage or turbinate edema/swelling.  No injection or focal lesion.  Mouth: lips without lesion/swelling.  Oral mucosa pink and moist.  Dentition intact and without obvious caries or gingival swelling.  Oropharynx without erythema, exudate, or swelling.  Neck: supple/nontender.  No LAD, mass, or TM.  Carotid pulses 2+ bilaterally, without bruits. CV: RRR, no m/r/g.   LUNGS: CTA bilat, nonlabored resps, good aeration in all lung  fields. ABD: soft, NT, ND, BS normal.  No hepatospenomegaly or mass.  No bruits. EXT: no clubbing, cyanosis, or edema.  Musculoskeletal: no joint swelling, erythema, warmth, or tenderness.  ROM of all joints intact. Skin - no sores or suspicious lesions or rashes or color changes  Pertinent labs:  Lab Results  Component Value Date   TSH 1.03 04/14/2023   Lab Results  Component Value Date   WBC 6.5 04/14/2023   HGB 15.3 04/14/2023   HCT 47.3 04/14/2023   MCV 91.2 04/14/2023   PLT 205.0 04/14/2023   Lab Results  Component Value Date   CREATININE 0.88 04/14/2023   BUN 12 04/14/2023   NA 138 04/14/2023   K 4.4 04/14/2023   CL 104 04/14/2023   CO2 23 04/14/2023   Lab Results  Component Value Date   ALT 16 04/14/2023   AST 18 04/14/2023   ALKPHOS 49 04/14/2023   BILITOT 0.6 04/14/2023   Lab Results  Component Value Date   CHOL 190 04/14/2023   Lab Results  Component Value Date   HDL 53.10 04/14/2023   Lab Results  Component Value Date   LDLCALC 103 (H) 04/14/2023   Lab Results  Component Value Date   TRIG 166.0 (H) 04/14/2023   Lab Results  Component Value Date   CHOLHDL 4 04/14/2023   Lab Results  Component Value Date   IRON 106 04/15/2023   TIBC 406.0 04/15/2023   FERRITIN 69.4 04/15/2023   Lab Results  Component Value Date   VITAMINB12  144 (L) 04/14/2023   Last vitamin D  Lab Results  Component Value Date   VD25OH 24.24 (L) 04/16/2023   ASSESSMENT AND PLAN:   #1 Health maintenance exam: Reviewed age and gender appropriate health maintenance issues (prudent diet, regular exercise, health risks of tobacco and excessive alcohol, use of seatbelts, fire alarms in home, use of sunscreen).  Also reviewed age and gender appropriate health screening as well as vaccine recommendations. Vaccines: Tdap UTD. Labs: fasting HP.  Additionally, due to his elimination of all meat from his diet for the last 2 years (alpha gal allergy ) I will check iron and vitamin B12 levels.  He has plans to follow-up with his allergist. Prostate ca screening: average risk patient= as per latest guidelines, start screening at 10 yrs of age. Colon ca screening: average risk patient= as per latest guidelines, start screening at 62 yrs of age.   # Alpha gal allergy --> avoids mammalian meat. As a result he has had a history of vitamin B12 deficiency and low normal iron levels. Also has a history of vitamin D  deficiency.  He was on supplements at one point but nothing lately.  We will recheck levels today.  An After Visit Summary was printed and given to the patient.  FOLLOW UP:  Return in about 1 year (around 05/11/2025) for annual CPE (fasting).  Signed:  Gerlene Hockey, MD           05/11/2024

## 2024-05-11 NOTE — Patient Instructions (Signed)

## 2024-05-12 ENCOUNTER — Ambulatory Visit: Payer: Self-pay | Admitting: Family Medicine

## 2024-09-11 ENCOUNTER — Other Ambulatory Visit: Payer: Self-pay | Admitting: Family Medicine
